# Patient Record
Sex: Female | Born: 1965 | Race: Black or African American | Hispanic: No | Marital: Single | Smoking: Current every day smoker
Health system: Southern US, Community
[De-identification: ages and names within clinical notes are randomized; demographics above are authoritative.]

## PROBLEM LIST (undated history)

## (undated) DIAGNOSIS — M1990 Unspecified osteoarthritis, unspecified site: Secondary | ICD-10-CM

## (undated) DIAGNOSIS — I10 Essential (primary) hypertension: Secondary | ICD-10-CM

## (undated) DIAGNOSIS — R011 Cardiac murmur, unspecified: Secondary | ICD-10-CM

## (undated) HISTORY — DX: Cardiac murmur, unspecified: R01.1

## (undated) HISTORY — PX: CARDIAC SURGERY: SHX584

## (undated) HISTORY — PX: KNEE SURGERY: SHX244

## (undated) HISTORY — DX: Unspecified osteoarthritis, unspecified site: M19.90

## (undated) HISTORY — DX: Essential (primary) hypertension: I10

## (undated) HISTORY — PX: ABDOMINAL HYSTERECTOMY: SHX81

---

## 2017-07-04 ENCOUNTER — Ambulatory Visit (INDEPENDENT_AMBULATORY_CARE_PROVIDER_SITE_OTHER): Payer: No Typology Code available for payment source | Admitting: Family Medicine

## 2017-07-04 ENCOUNTER — Encounter: Payer: Self-pay | Admitting: Family Medicine

## 2017-07-04 VITALS — BP 127/90 | HR 69 | Temp 98.3°F | Ht 65.0 in | Wt 236.2 lb

## 2017-07-04 DIAGNOSIS — F17200 Nicotine dependence, unspecified, uncomplicated: Secondary | ICD-10-CM

## 2017-07-04 DIAGNOSIS — F1099 Alcohol use, unspecified with unspecified alcohol-induced disorder: Secondary | ICD-10-CM | POA: Diagnosis not present

## 2017-07-04 DIAGNOSIS — M19012 Primary osteoarthritis, left shoulder: Secondary | ICD-10-CM | POA: Diagnosis not present

## 2017-07-04 DIAGNOSIS — M170 Bilateral primary osteoarthritis of knee: Secondary | ICD-10-CM | POA: Diagnosis not present

## 2017-07-04 DIAGNOSIS — Z7689 Persons encountering health services in other specified circumstances: Secondary | ICD-10-CM

## 2017-07-04 DIAGNOSIS — IMO0002 Reserved for concepts with insufficient information to code with codable children: Secondary | ICD-10-CM

## 2017-07-04 DIAGNOSIS — I10 Essential (primary) hypertension: Secondary | ICD-10-CM

## 2017-07-04 LAB — CBC WITH DIFFERENTIAL/PLATELET
BASOS ABS: 94 {cells}/uL (ref 0–200)
BASOS PCT: 1 %
EOS ABS: 94 {cells}/uL (ref 15–500)
EOS PCT: 1 %
HCT: 41.9 % (ref 35.0–45.0)
HEMOGLOBIN: 13.9 g/dL (ref 11.7–15.5)
LYMPHS ABS: 2820 {cells}/uL (ref 850–3900)
Lymphocytes Relative: 30 %
MCH: 28.3 pg (ref 27.0–33.0)
MCHC: 33.2 g/dL (ref 32.0–36.0)
MCV: 85.3 fL (ref 80.0–100.0)
MPV: 10.2 fL (ref 7.5–12.5)
Monocytes Absolute: 752 cells/uL (ref 200–950)
Monocytes Relative: 8 %
NEUTROS ABS: 5640 {cells}/uL (ref 1500–7800)
Neutrophils Relative %: 60 %
PLATELETS: 278 10*3/uL (ref 140–400)
RBC: 4.91 MIL/uL (ref 3.80–5.10)
RDW: 13.9 % (ref 11.0–15.0)
WBC: 9.4 10*3/uL (ref 3.8–10.8)

## 2017-07-04 LAB — TSH: TSH: 3.07 mIU/L

## 2017-07-04 LAB — T4, FREE: FREE T4: 1.2 ng/dL (ref 0.8–1.8)

## 2017-07-04 NOTE — Progress Notes (Addendum)
Subjective:    Patient ID: Rose Little, female    DOB: February 11, 1966, 51 y.o.   MRN: [id number]  Chief Complaint  Patient presents with  . Establish Care    HPI Patient is in today to establish care.  She was formerly seen in [place], [place].  Pt recently moved to the area after ending a 16 yr relationship.  Pt has a good friend in the area.    Past medical history significant for  -hypertension: Last time on blood pressure medicine was in February 2017. -Arthritis: Left shoulder, bilateral knees. Last knee injection December 17. Injection and left knee was less effective than previously. -Unsure about thyroid problems. States may have had radiation in her 53s.  Social history: Patient recently ending 16 year relationship moved to the area from [place]. 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 Currently smokes 4 cigarettes per day times the last 20 years. Currently endorses ethanol use 2 12 ounce cans of beer per day, which is down from 4 cases per day. Patient endorses past marijuana use.  Past surgical history: Right meniscus surgery in 2016 by Dr. Val Eagle? in [place] Hysterectomy for fibroids years ago Cardiac ablation 09/2016  Family history: Mother-cirrhosis, lupus, hypertension Father-ethanol use, other history unknown Sister-hypertension, fibroids Maternal grandmother-hypertension, pacemaker Maternal grandfather-history unknown Paternal grandparents-history unknown  Past Medical History:  Diagnosis Date  . Arthritis   . Heart murmur   . Hypertension     Past Surgical History:  Procedure Laterality Date  . ABDOMINAL HYSTERECTOMY    . CARDIAC SURGERY    . KNEE SURGERY Right     Family History  Problem Relation Age of Onset  . Alcohol abuse Mother   . Stroke Mother   . Alcohol abuse Father   . Stroke Father   . Arthritis Maternal Grandmother   . Heart disease Maternal Grandmother   . Stroke Maternal Grandmother    . Hypertension Maternal Grandmother     Social History   Social History  . Marital status: Single    Spouse name: N/A  . Number of children: N/A  . Years of education: N/A   Occupational History  . Not on file.   Social History Main Topics  . Smoking status: Current Every Day Smoker    Packs/day: 4.00    Years: 20.00    Types: Cigarettes  . Smokeless tobacco: Never Used  . Alcohol use Yes     Comment: 2 cans beer/day  . Drug use: No  . Sexual activity: Not on file   Other Topics Concern  . Not on file   Social History Narrative  . No narrative on file    No outpatient prescriptions prior to visit.   No facility-administered medications prior to visit.     No Known Allergies  ROS  General: Denies fever, chills, night sweats, changes in weight, changes in appetite HEENT: Denies headaches, ear pain, changes in vision, rhinorrhea, sore throat CV: Denies CP, palpitations, SOB, orthopnea Pulm: Denies SOB, cough, wheezing   +Snoring GI: Denies abdominal pain, nausea, vomiting, diarrhea, constipation GU: Denies dysuria, hematuria, frequency, vaginal discharge Msk: Denies muscle cramps.  +H/O joint pains 2/2 arthritis Neuro: Denies weakness, numbness, tingling Skin: Denies rashes, bruising Psych: Denies depression, anxiety, hallucinations'     Objective:    Blood pressure 127/90, pulse 69, temperature 98.3 F (36.8 C), temperature source Oral, height 5\' 5"  (1.651 m), weight 236 lb 3.2 oz (107.1 kg), SpO2 99 %.  Gen. Pleasant, well-nourished, in no distress, normal affect  HEENT -[place]/AT, no lesions, face symmetric, no scleral icterus, PERRLA, no post nasal drip. Neck: No JVD, no thyromegaly, no carotid bruits Lungs: no accessory muscle uss, CTAB, no wheezes or rales Cardiovascular: RR, 2/6 murmur present.  no r/g, no peripheral edema Abdomen: soft and non-tender, no hepatospllenomegaly, BS normal. Musculoskeletal: No deformities, no cyanosis or clubbing, normal  tone Neuro:  A&Ox3, CN II-XII intact, normal gait Skin:  Warm, no lesions/ rash  Tattoos on UEs   Wt Readings from Last 3 Encounters:  07/04/17 236 lb 3.2 oz (107.1 kg)    Diabetic Foot Exam - Simple   No data filed     No results found for: WBC, HGB, HCT, PLT, GLUCOSE, CHOL, TRIG, HDL, LDLDIRECT, LDLCALC, ALT, AST, NA, K, CL, CREATININE, BUN, CO2, TSH, PSA, INR, GLUF, HGBA1C, MICROALBUR  Assessment/Plan:  1. Establishing care with new doctor, encounter for Records release form completed to obtain records from previous physicians in [place].  2. Essential hypertension Not currently on medication. Will f/u to recheck in 3 months. Lifestyle modifications encouraged. - TSH; Future - CBC with Differential/Platelet; Future - T4, Free; Future - TSH - CBC with Differential/Platelet - T4, Free  3. Alcohol use disorder (HCC) Currently drinking 2 12 oz cans/day.  Discussed continuing to cut down - CMP; Future - CMP  4. Tobacco use disorder -Smoking cessation counseling <10 min, advised to quit. -Pt currently smoking 4 cigarettes/day -Encouraged to decrease use by 1 cigarette per day.  Considering decreasing use. -offered support 1-800 QUIT NOW -[facility]  5. Arthritis, multiple sites (b/l knee, L shoulder) -currently stable

## 2017-07-04 NOTE — Patient Instructions (Addendum)
Today you were seen to establish care.  You were asked to sign a records release form so we can obtain your medical records from your previous provider.   Today you had labs drawn.  You should expect to hear back from us with your results in 2-3 business days.  Continue walking for exercise and eating more vegetables.    You are doing great at working on decreasing the number of cigarettes you are smoking per day.     Follow up in 3 months.

## 2017-07-05 LAB — COMPREHENSIVE METABOLIC PANEL
ALBUMIN: 4.5 g/dL (ref 3.6–5.1)
ALK PHOS: 81 U/L (ref 33–130)
ALT: 12 U/L (ref 6–29)
AST: 17 U/L (ref 10–35)
BUN: 12 mg/dL (ref 7–25)
CO2: 24 mmol/L (ref 20–32)
CREATININE: 0.69 mg/dL (ref 0.50–1.05)
Calcium: 9.8 mg/dL (ref 8.6–10.4)
Chloride: 104 mmol/L (ref 98–110)
Glucose, Bld: 88 mg/dL (ref 65–99)
Potassium: 4 mmol/L (ref 3.5–5.3)
SODIUM: 139 mmol/L (ref 135–146)
TOTAL PROTEIN: 7.5 g/dL (ref 6.1–8.1)
Total Bilirubin: 0.4 mg/dL (ref 0.2–1.2)

## 2017-07-08 NOTE — Progress Notes (Signed)
Lab results are all normal.  Patient called.  Left message for patient to call back.

## 2017-08-05 ENCOUNTER — Emergency Department (HOSPITAL_COMMUNITY)
Admission: EM | Admit: 2017-08-05 | Discharge: 2017-08-05 | Disposition: A | Discharge status: Home / Self Care | Payer: No Typology Code available for payment source | Attending: Emergency Medicine | Admitting: Emergency Medicine

## 2017-08-05 ENCOUNTER — Encounter (HOSPITAL_COMMUNITY): Payer: Self-pay

## 2017-08-05 ENCOUNTER — Emergency Department (HOSPITAL_COMMUNITY): Payer: No Typology Code available for payment source

## 2017-08-05 DIAGNOSIS — Y999 Unspecified external cause status: Secondary | ICD-10-CM | POA: Insufficient documentation

## 2017-08-05 DIAGNOSIS — M25562 Pain in left knee: Secondary | ICD-10-CM

## 2017-08-05 DIAGNOSIS — G8929 Other chronic pain: Secondary | ICD-10-CM

## 2017-08-05 DIAGNOSIS — Y939 Activity, unspecified: Secondary | ICD-10-CM | POA: Diagnosis not present

## 2017-08-05 DIAGNOSIS — S8392XA Sprain of unspecified site of left knee, initial encounter: Secondary | ICD-10-CM | POA: Diagnosis not present

## 2017-08-05 DIAGNOSIS — I10 Essential (primary) hypertension: Secondary | ICD-10-CM | POA: Insufficient documentation

## 2017-08-05 DIAGNOSIS — M25561 Pain in right knee: Secondary | ICD-10-CM | POA: Diagnosis not present

## 2017-08-05 DIAGNOSIS — F17210 Nicotine dependence, cigarettes, uncomplicated: Secondary | ICD-10-CM | POA: Diagnosis not present

## 2017-08-05 DIAGNOSIS — Y929 Unspecified place or not applicable: Secondary | ICD-10-CM | POA: Diagnosis not present

## 2017-08-05 DIAGNOSIS — X509XXA Other and unspecified overexertion or strenuous movements or postures, initial encounter: Secondary | ICD-10-CM | POA: Insufficient documentation

## 2017-08-05 DIAGNOSIS — S80912A Unspecified superficial injury of left knee, initial encounter: Secondary | ICD-10-CM | POA: Diagnosis present

## 2017-08-05 DIAGNOSIS — M25569 Pain in unspecified knee: Secondary | ICD-10-CM

## 2017-08-05 MED ORDER — HYDROCODONE-ACETAMINOPHEN 5-325 MG PO TABS: 1.0000 | ORAL_TABLET | Freq: Four times a day (QID) | ORAL | 0 refills | Status: DC | PRN

## 2017-08-05 MED ORDER — HYDROCODONE-ACETAMINOPHEN 5-325 MG PO TABS
2.0000 | ORAL_TABLET | Freq: Once | ORAL | Status: AC
  Administered 2017-08-05: 2 via ORAL
  Filled 2017-08-05: qty 2

## 2017-08-05 NOTE — ED Triage Notes (Signed)
Patient complains of bilateral knee pain that she describes as chronic. Does a lot of standing at work and reports worse for 1 day, denies trauma

## 2017-08-05 NOTE — ED Notes (Signed)
Pt to xray

## 2017-08-05 NOTE — Discharge Instructions (Signed)
Wear the knee immobilizer in the left knee for support and stabilization. Use the crutches to stay off of left knee and provide rest. With a knee sleeve on the right knee for support and stabilization.  You can take Tylenol or Ibuprofen as directed for pain. You can take the pain medication for severe breakthrough pain.  Follow the RICE (Rest, Ice, Compression, Elevation) protocol as directed.   Follow-up with referred orthopedic doctor for further evaluation of her knee pain. Call and arrange for an appointment. Told them that you were seen in the emergency department.  Follow-up with your primary care doctor regarding the high blood pressure. Your blood pressure needs to be reevaluated. It may be they need to be restarted on her medications.  Return the emergency Department for any worsening knee pain, redness/swelling of the knee, fevers, difficulty breathing, chest pain or any other worsening or concerning symptoms.

## 2017-08-05 NOTE — ED Provider Notes (Signed)
[facility]-EMERGENCY DEPT Provider Note   CSN: [id number] Arrival date & time: 08/05/17  0940     History   Chief Complaint Chief Complaint  Patient presents with  . Knee Pain    HPI Rose Little is a 51 y.o. female past medical history of arthritis who presents with bilateral knee pain, left greater than worse I began 2 days ago. Patient denies any preceding trauma, injury, fall. 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 Patient reports that she has a history of arthritis and takes Tylenol daily for the symptoms. She had a previous orthopedic doctor in [place] where she just recently moved from but no orthopedic follow-up in [place]. Patient reports that she still been able to ambulate but reports that her pain is worsened with ambulation or movement of the bilateral lower extremities. She denies any warmth or redness to bilateral knees. She denies any fever, chills, chest pain, difficulty breathing, numbness/weakness of her arms or legs.  The history is provided by the patient.    Past Medical History:  Diagnosis Date  . Arthritis   . Heart murmur   . Hypertension     Patient Active Problem List   Diagnosis Date Noted  . HTN (hypertension) 07/04/2017  . Tobacco use disorder 07/04/2017    Past Surgical History:  Procedure Laterality Date  . ABDOMINAL HYSTERECTOMY    . CARDIAC SURGERY    . KNEE SURGERY Right     OB History    No data available       Home Medications    Prior to Admission medications   Medication Sig Start Date End Date Taking? Authorizing Provider  HYDROcodone-acetaminophen (NORCO/VICODIN) 5-325 MG tablet Take 1-2 tablets by mouth every 6 (six) hours as needed. 08/05/17   Maxwell Caul, PA-C    Family History Family History  Problem Relation Age of Onset  . Alcohol abuse Mother   . Stroke Mother   . Alcohol abuse Father   . Stroke Father   . Arthritis Maternal Grandmother   . Heart disease  Maternal Grandmother   . Stroke Maternal Grandmother   . Hypertension Maternal Grandmother     Social History Social History  Substance Use Topics  . Smoking status: Current Every Day Smoker    Packs/day: 4.00    Years: 20.00    Types: Cigarettes  . Smokeless tobacco: Never Used  . Alcohol use Yes     Comment: 2 cans beer/day     Allergies   Patient has no known allergies.   Review of Systems Review of Systems  Constitutional: Negative for fever.  Respiratory: Negative for shortness of breath.   Cardiovascular: Negative for chest pain.  Gastrointestinal: Negative for nausea and vomiting.  Musculoskeletal:       Bilateral knee pain  Skin: Negative for color change.     Physical Exam Updated Vital Signs BP (!) 166/99   Pulse 61   Temp 98.4 F (36.9 C) (Oral)   Resp 18   SpO2 100%   Physical Exam  Constitutional: She appears well-developed and well-nourished.  Appears uncomfortable but no acute distress   HENT:  Head: Normocephalic and atraumatic.  Eyes: Conjunctivae and EOM are normal. Right eye exhibits no discharge. Left eye exhibits no discharge. No scleral icterus.  Pulmonary/Chest: Effort normal.  Musculoskeletal:  Tenderness palpation in the medial aspect of the right knee with no deformity or crepitus noted. No overlying soft tissue swelling, warmth, erythema, ecchymosis.  Flexion extension intact but with subjective reports of pain. Negative anterior/posterior drawer test. No laxity with valgus or varus stress. Tenderness palpation to the medial and inferior aspect of the left knee with no deformity or crepitus noted. No overlying soft tissue swelling, warmth, erythema, ecchymosis. Flexion and extension intact but with subjective reports of pain. Mild laxity noted on anterior drawer test. Mild laxity with varus stress.  Neurological: She is alert.  Skin: Skin is warm and dry.  Psychiatric: She has a normal mood and affect. Her speech is normal and behavior  is normal.  Nursing note and vitals reviewed.    ED Treatments / Results  Labs (all labs ordered are listed, but only abnormal results are displayed) Labs Reviewed - No data to display  EKG  EKG Interpretation None       Radiology Dg Knee Complete 4 Views Left  Result Date: 08/05/2017 CLINICAL DATA:  Knee pain. EXAM: LEFT KNEE - COMPLETE 4+ VIEW COMPARISON:  No recent prior. FINDINGS: Tricompartment degenerative change. No evidence of fracture or dislocation. IMPRESSION: Tricompartment degenerative change.  No acute abnormality. Electronically Signed   By: Maisie Fus  Register   On: 08/05/2017 12:14   Dg Knee Complete 4 Views Right  Result Date: 08/05/2017 CLINICAL DATA:  Bilateral knee pain chronically, stands at work EXAM: RIGHT KNEE - COMPLETE 4+ VIEW COMPARISON:  None. FINDINGS: There is somewhat age advanced tricompartmental degenerative joint disease primarily involving the medial compartment. Medially there is more loss of joint space with sclerosis and spurring present. No fracture is seen and no joint effusion is noted. 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 Electronically Signed   By: Dwyane Dee M.D.   On: 08/05/2017 12:17    Procedures Procedures (including critical care time)  Medications Ordered in ED Medications  HYDROcodone-acetaminophen (NORCO/VICODIN) 5-325 MG per tablet 2 tablet (2 tablets Oral Given 08/05/17 1119)     Initial Impression / Assessment and Plan / ED Course  I have reviewed the triage vital signs and the nursing notes.  Pertinent labs & imaging results that were available during my care of the patient were reviewed by me and considered in my medical decision making (see chart for details).     51 year old female who presents with bilateral knee pain. No preceding trauma, injury, fall. History of arthritis. Not currently followed by orthopedics. Patient is afebrile, non-toxic appearing, sitting  comfortably on examination table. Vital signs reviewed. Patient is slightly hypertensive, likely secondary to pain. Will plan to reassess. Physical exam shows tenderness bilateral knees. Left knee has some mild instability on anterior drawer test. Consider sprain versus dislocation versus fracture versus osteoarthritis. History/physical exam are not concerning for septic or alertness, DVT. We'll plan to obtain x-ray imaging for further evaluation.  X-ray reviewed. Negative for any acute fracture or dislocation. There is evidence of degenerative changes. Discussed results with patient. Given history/physical exam findings of left knee, concern that there is a left knee sprain. We'll plan to apply any immobilizer to left knee and gave patient crutches for support and stabilization. Also plan to apply knee sleeve to right knee.  Patient reviewed on the [place] substance database. She has no recent narcotic prescriptions. We'll plan to give her short course for pain control. Plan to provide outpatient orthopedic referral for further follow-up and evaluation. Strict return precautions discussed. Patient expresses understanding and agreement to plan.   Patient noted be hypertensive at initial vital evaluation. Patient has a history of hypertension and  reports that she was on medication but that she will stop because the hypertension was under control. Patient states that she is currently not on any blood pressure medication. She does have a primary care doctor establishing [place]. Patient is not complaining of any headache, vision changes, numbness/weakness of her arms or legs, chest pain at this time. At this time there is nothing to suggest hypertensive urgency or emergency.  Patient was advised to follow up with their PCP or Wellness clinic regarding their elevated blood pressure.   Final Clinical Impressions(s) / ED Diagnoses   Final diagnoses:  Knee pain  Chronic pain of both knees  Sprain of left knee,  unspecified ligament, initial encounter    New Prescriptions Discharge Medication List as of 08/05/2017  1:41 PM    START taking these medications   Details  HYDROcodone-acetaminophen (NORCO/VICODIN) 5-325 MG tablet Take 1-2 tablets by mouth every 6 (six) hours as needed., Starting Tue 08/05/2017, Print         Maxwell CaulLayden, Moet Mikulski A, PA-C 08/05/17 1724    Pricilla LovelessGoldston, Scott, MD 08/08/17 1118

## 2017-09-09 ENCOUNTER — Ambulatory Visit (INDEPENDENT_AMBULATORY_CARE_PROVIDER_SITE_OTHER): Payer: No Typology Code available for payment source | Admitting: Family Medicine

## 2017-09-09 VITALS — BP 193/100 | HR 88 | Temp 98.4°F | Wt 241.8 lb

## 2017-09-09 DIAGNOSIS — I10 Essential (primary) hypertension: Secondary | ICD-10-CM

## 2017-09-09 DIAGNOSIS — Z13220 Encounter for screening for lipoid disorders: Secondary | ICD-10-CM

## 2017-09-09 DIAGNOSIS — R0602 Shortness of breath: Secondary | ICD-10-CM

## 2017-09-09 DIAGNOSIS — R011 Cardiac murmur, unspecified: Secondary | ICD-10-CM

## 2017-09-09 LAB — LIPID PANEL
CHOL/HDL RATIO: 4
CHOLESTEROL: 192 mg/dL (ref 0–200)
HDL: 46.8 mg/dL (ref >39.00)
NonHDL: 144.74
TRIGLYCERIDES: 236 mg/dL — AB (ref 0.0–149.0)
VLDL: 47.2 mg/dL — AB (ref 0.0–40.0)

## 2017-09-09 LAB — BASIC METABOLIC PANEL
BUN: 15 mg/dL (ref 6–23)
CO2: 24 mEq/L (ref 19–32)
Calcium: 9.9 mg/dL (ref 8.4–10.5)
Chloride: 105 mEq/L (ref 96–112)
Creatinine, Ser: 0.68 mg/dL (ref 0.40–1.20)
GFR: 116.57 mL/min (ref >60.00)
Glucose, Bld: 140 mg/dL — ABNORMAL HIGH (ref 70–99)
Potassium: 4.2 mEq/L (ref 3.5–5.1)
Sodium: 140 mEq/L (ref 135–145)

## 2017-09-09 LAB — BRAIN NATRIURETIC PEPTIDE: Pro B Natriuretic peptide (BNP): 52 pg/mL (ref 0.0–100.0)

## 2017-09-09 LAB — LDL CHOLESTEROL, DIRECT: Direct LDL: 113 mg/dL

## 2017-09-09 MED ORDER — LISINOPRIL 10 MG PO TABS: 10.0000 mg | ORAL_TABLET | Freq: Every day | ORAL | 3 refills | Status: DC

## 2017-09-09 NOTE — Patient Instructions (Addendum)
DASH Eating Plan DASH stands for "Dietary Approaches to Stop Hypertension." The DASH eating plan is a healthy eating plan that has been shown to reduce high blood pressure (hypertension). It may also reduce your risk for type 2 diabetes, heart disease, and stroke. The DASH eating plan may also help with weight loss. What are tips for following this plan? General guidelines  Avoid eating more than 2,300 mg (milligrams) of salt (sodium) a day. If you have hypertension, you may need to reduce your sodium intake to 1,500 mg a day.  Limit alcohol intake to no more than 1 drink a day for nonpregnant women and 2 drinks a day for men. One drink equals 12 oz of beer, 5 oz of wine, or 1 oz of hard liquor.  Work with your health care provider to maintain a healthy body weight or to lose weight. Ask what an ideal weight is for you.  Get at least 30 minutes of exercise that causes your heart to beat faster (aerobic exercise) most days of the week. Activities may include walking, swimming, or biking.  Work with your health care provider or diet and nutrition specialist (dietitian) to adjust your eating plan to your individual calorie needs. Reading food labels  Check food labels for the amount of sodium per serving. Choose foods with less than 5 percent of the Daily Value of sodium. Generally, foods with less than 300 mg of sodium per serving fit into this eating plan.  To find whole grains, look for the word "whole" as the first word in the ingredient list. Shopping  Buy products labeled as "low-sodium" or "no salt added."  Buy fresh foods. Avoid canned foods and premade or frozen meals. Cooking  Avoid adding salt when cooking. Use salt-free seasonings or herbs instead of table salt or sea salt. Check with your health care provider or pharmacist before using salt substitutes.  Do not fry foods. Cook foods using healthy methods such as baking, boiling, grilling, and broiling instead.  Cook with  heart-healthy oils, such as olive, canola, soybean, or sunflower oil. Meal planning   Eat a balanced diet that includes: ? 5 or more servings of fruits and vegetables each day. At each meal, try to fill half of your plate with fruits and vegetables. ? Up to 6-8 servings of whole grains each day. ? Less than 6 oz of lean meat, poultry, or fish each day. A 3-oz serving of meat is about the same size as a deck of cards. One egg equals 1 oz. ? 2 servings of low-fat dairy each day. ? A serving of nuts, seeds, or beans 5 times each week. ? Heart-healthy fats. Healthy fats called Omega-3 fatty acids are found in foods such as flaxseeds and coldwater fish, like sardines, salmon, and mackerel.  Limit how much you eat of the following: ? Canned or prepackaged foods. ? Food that is high in trans fat, such as fried foods. ? Food that is high in saturated fat, such as fatty meat. ? Sweets, desserts, sugary drinks, and other foods with added sugar. ? Full-fat dairy products.  Do not salt foods before eating.  Try to eat at least 2 vegetarian meals each week.  Eat more home-cooked food and less restaurant, buffet, and fast food.  When eating at a restaurant, ask that your food be prepared with less salt or no salt, if possible. What foods are recommended? The items listed may not be a complete list. Talk with your dietitian about what   dietary choices are best for you. Grains Whole-grain or whole-wheat bread. Whole-grain or whole-wheat pasta. Brown rice. Oatmeal. Quinoa. Bulgur. Whole-grain and low-sodium cereals. Pita bread. Low-fat, low-sodium crackers. Whole-wheat flour tortillas. Vegetables Fresh or frozen vegetables (raw, steamed, roasted, or grilled). Low-sodium or reduced-sodium tomato and vegetable juice. Low-sodium or reduced-sodium tomato sauce and tomato paste. Low-sodium or reduced-sodium canned vegetables. Fruits All fresh, dried, or frozen fruit. Canned fruit in natural juice (without  added sugar). Meat and other protein foods Skinless chicken or turkey. Ground chicken or turkey. Pork with fat trimmed off. Fish and seafood. Egg whites. Dried beans, peas, or lentils. Unsalted nuts, nut butters, and seeds. Unsalted canned beans. Lean cuts of beef with fat trimmed off. Low-sodium, lean deli meat. Dairy Low-fat (1%) or fat-free (skim) milk. Fat-free, low-fat, or reduced-fat cheeses. Nonfat, low-sodium ricotta or cottage cheese. Low-fat or nonfat yogurt. Low-fat, low-sodium cheese. Fats and oils Soft margarine without trans fats. Vegetable oil. Low-fat, reduced-fat, or light mayonnaise and salad dressings (reduced-sodium). Canola, safflower, olive, soybean, and sunflower oils. Avocado. Seasoning and other foods Herbs. Spices. Seasoning mixes without salt. Unsalted popcorn and pretzels. Fat-free sweets. What foods are not recommended? The items listed may not be a complete list. Talk with your dietitian about what dietary choices are best for you. Grains Baked goods made with fat, such as croissants, muffins, or some breads. Dry pasta or rice meal packs. Vegetables Creamed or fried vegetables. Vegetables in a cheese sauce. Regular canned vegetables (not low-sodium or reduced-sodium). Regular canned tomato sauce and paste (not low-sodium or reduced-sodium). Regular tomato and vegetable juice (not low-sodium or reduced-sodium). Pickles. Olives. Fruits Canned fruit in a light or heavy syrup. Fried fruit. Fruit in cream or butter sauce. Meat and other protein foods Fatty cuts of meat. Ribs. Fried meat. Bacon. Sausage. Bologna and other processed lunch meats. Salami. Fatback. Hotdogs. Bratwurst. Salted nuts and seeds. Canned beans with added salt. Canned or smoked fish. Whole eggs or egg yolks. Chicken or turkey with skin. Dairy Whole or 2% milk, cream, and half-and-half. Whole or full-fat cream cheese. Whole-fat or sweetened yogurt. Full-fat cheese. Nondairy creamers. Whipped toppings.  Processed cheese and cheese spreads. Fats and oils Butter. Stick margarine. Lard. Shortening. Ghee. Bacon fat. Tropical oils, such as coconut, palm kernel, or palm oil. Seasoning and other foods Salted popcorn and pretzels. Onion salt, garlic salt, seasoned salt, table salt, and sea salt. Worcestershire sauce. Tartar sauce. Barbecue sauce. Teriyaki sauce. Soy sauce, including reduced-sodium. Steak sauce. Canned and packaged gravies. Fish sauce. Oyster sauce. Cocktail sauce. Horseradish that you find on the shelf. Ketchup. Mustard. Meat flavorings and tenderizers. Bouillon cubes. Hot sauce and Tabasco sauce. Premade or packaged marinades. Premade or packaged taco seasonings. Relishes. Regular salad dressings. Where to find more information:  National Heart, Lung, and Blood Institute: www.nhlbi.nih.gov  American Heart Association: www.heart.org Summary  The DASH eating plan is a healthy eating plan that has been shown to reduce high blood pressure (hypertension). It may also reduce your risk for type 2 diabetes, heart disease, and stroke.  With the DASH eating plan, you should limit salt (sodium) intake to 2,300 mg a day. If you have hypertension, you may need to reduce your sodium intake to 1,500 mg a day.  When on the DASH eating plan, aim to eat more fresh fruits and vegetables, whole grains, lean proteins, low-fat dairy, and heart-healthy fats.  Work with your health care provider or diet and nutrition specialist (dietitian) to adjust your eating plan to your individual   calorie needs. This information is not intended to replace advice given to you by your health care provider. Make sure you discuss any questions you have with your health care provider. Document Released: 10/31/2011 Document Revised: 11/04/2016 Document Reviewed: 11/04/2016 Elsevier Interactive Patient Education  2017 [employer] Hypertension Hypertension is another name for high blood pressure. High blood pressure forces  your heart to work harder to pump blood. This can cause problems over time. There are two numbers in a blood pressure reading. There is a top number (systolic) over a bottom number (diastolic). It is best to have a blood pressure below 120/80. Healthy choices can help lower your blood pressure. You may need medicine to help lower your blood pressure if:  Your blood pressure cannot be lowered with healthy choices.  Your blood pressure is higher than 130/80.  Follow these instructions at home: Eating and drinking  If directed, follow the DASH eating plan. This diet includes: ? Filling half of your plate at each meal with fruits and vegetables. ? Filling one quarter of your plate at each meal with whole grains. Whole grains include whole wheat pasta, brown rice, and whole grain bread. ? Eating or drinking low-fat dairy products, such as skim milk or low-fat yogurt. ? Filling one quarter of your plate at each meal with low-fat (lean) proteins. Low-fat proteins include fish, skinless chicken, eggs, beans, and tofu. ? Avoiding fatty meat, cured and processed meat, or chicken with skin. ? Avoiding premade or processed food.  Eat less than 1,500 mg of salt (sodium) a day.  Limit alcohol use to no more than 1 drink a day for nonpregnant women and 2 drinks a day for men. One drink equals 12 oz of beer, 5 oz of wine, or 1 oz of hard liquor. Lifestyle  Work with your doctor to stay at a healthy weight or to lose weight. Ask your doctor what the best weight is for you.  Get at least 30 minutes of exercise that causes your heart to beat faster (aerobic exercise) most days of the week. This may include walking, swimming, or biking.  Get at least 30 minutes of exercise that strengthens your muscles (resistance exercise) at least 3 days a week. This may include lifting weights or pilates.  Do not use any products that contain nicotine or tobacco. This includes cigarettes and e-cigarettes. If you need  help quitting, ask your doctor.  Check your blood pressure at home as told by your doctor.  Keep all follow-up visits as told by your doctor. This is important. Medicines  Take over-the-counter and prescription medicines only as told by your doctor. Follow directions carefully.  Do not skip doses of blood pressure medicine. The medicine does not work as well if you skip doses. Skipping doses also puts you at risk for problems.  Ask your doctor about side effects or reactions to medicines that you should watch for. Contact a doctor if:  You think you are having a reaction to the medicine you are taking.  You have headaches that keep coming back (recurring).  You feel dizzy.  You have swelling in your ankles.  You have trouble with your vision. Get help right away if:  You get a very bad headache.  You start to feel confused.  You feel weak or numb.  You feel faint.  You get very bad pain in your: ? Chest. ? Belly (abdomen).  You throw up (vomit) more than once.  You have trouble breathing. Summary    Hypertension is another name for high blood pressure.  Making healthy choices can help lower blood pressure. If your blood pressure cannot be controlled with healthy choices, you may need to take medicine. This information is not intended to replace advice given to you by your health care provider. Make sure you discuss any questions you have with your health care provider. Document Released: 04/29/2008 Document Revised: 10/09/2016 Document Reviewed: 10/09/2016 Elsevier Interactive Patient Education  2018 [employer] Inc[employer]  

## 2017-09-09 NOTE — Progress Notes (Signed)
Subjective:    Patient ID: Rose Little, female    DOB: [birth year], 51 y.o.   MRN: [id number]  No chief complaint on file.   HPI Patient was seen today for acute concern.  HTN, acute: -pt with h/o HTN.  Formerly on medicine yrs ago, but can't remember what it was. -BP noted to be elevated at Ortho, Dr. Champ Mungo office (205/198, 196/105) -Pt also endorses increased stress.  Had to move out of her friends house abruptly.  Staying with a family friend until her apt is ready in Nov.  Pt was not able get all of her belongings from her previous residence. -Denies HAs, CP, blurred vision, N/V, neck pain.  Breathing issues: -pt states at rest she has been breathing heavy, "like I'm snoring, but I'm awake" -This has been an ongoing issue, x yrs -difficulty going up 1-2 flights of stairs.  Has to rest after the 1st.  Is very winded after the 2nd. -also endorses pedal edema  Past Medical History:  Diagnosis Date  . Arthritis   . Heart murmur   . Hypertension     No Known Allergies  ROS General: Denies fever, chills, night sweats, changes in weight, changes in appetite HEENT: Denies headaches, ear pain, changes in vision, rhinorrhea, sore throat CV: Denies CP, palpitations, orthopnea  +pedal edema, SOB Pulm: Denies cough, wheezing  +dyspnea, SOB GI: Denies abdominal pain, nausea, vomiting, diarrhea, constipation GU: Denies dysuria, hematuria, frequency, vaginal discharge Msk: Denies muscle cramps, joint pains Neuro: Denies weakness, numbness, tingling Skin: Denies rashes, bruising Psych: Denies depression, anxiety, hallucinations     Objective:    SpO2 98 %.   Gen. Pleasant, well-nourished, in no distress, normal affect HEENT: Powers/AT, face symmetric,no scleral icterus, PERRLA, EOMI, nares patent without drainage. Neck: No JVD, no thyromegaly Lungs: no accessory muscle use, CTAB, no wheezes or rales Cardiovascular: RRR, 2/6 murmur best heard L upper sternal border,  1+  peripheral edema Neuro:  A&Ox3, CN II-XII intact, normal gait Skin:  Warm, no lesions/ rash   Wt Readings from Last 3 Encounters:  07/04/17 236 lb 3.2 oz (107.1 kg)    Lab Results  Component Value Date   WBC 9.4 07/04/2017   HGB 13.9 07/04/2017   HCT 41.9 07/04/2017   PLT 278 07/04/2017   GLUCOSE 88 07/04/2017   ALT 12 07/04/2017   AST 17 07/04/2017   NA 139 07/04/2017   K 4.0 07/04/2017   CL 104 07/04/2017   CREATININE 0.69 07/04/2017   BUN 12 07/04/2017   CO2 24 07/04/2017   TSH 3.07 07/04/2017    Assessment/Plan:  Essential hypertension  -Discussed ways to reduce stress, eat better, exercise to help with blood pressure -Given handout -Discussed reducing sodium intake. -Will start medication this visit -We'll have patient follow-up in 1 week for BP - Plan: Basic metabolic panel, Brain Natriuretic Peptide, lisinopril (PRINIVIL,ZESTRIL) 10 MG tablet  Screening cholesterol level  - Plan: Lipid panel  Shortness of breath -Discussed possible causes including deconditioning, CHF, infection -Will obtain BNP. -Will also obtain cardiac echo -Discussed elevating lower extremities when resting  Murmur, cardiac  - Plan: ECHOCARDIOGRAM LIMITED   F/u in 1 wk for bp.

## 2017-09-16 ENCOUNTER — Encounter: Payer: Self-pay | Admitting: Family Medicine

## 2017-09-16 ENCOUNTER — Ambulatory Visit (INDEPENDENT_AMBULATORY_CARE_PROVIDER_SITE_OTHER): Payer: No Typology Code available for payment source | Admitting: Family Medicine

## 2017-09-16 VITALS — BP 150/82 | HR 70 | Wt 235.4 lb

## 2017-09-16 DIAGNOSIS — I10 Essential (primary) hypertension: Secondary | ICD-10-CM

## 2017-09-16 MED ORDER — LISINOPRIL 20 MG PO TABS: 20.0000 mg | ORAL_TABLET | Freq: Every day | ORAL | 3 refills | Status: DC

## 2017-09-16 NOTE — Patient Instructions (Addendum)
At this visit we increased your blood pressure medicine, lisinopril to 20 mg.  A new prescription was sent to your pharmacy for the lisinopril 20 mg. Using come back to clinic in the next 2-3 weeks to follow-up on her blood pressure. If you have problems before then feel free to come back sooner.  The referral for your echo was placed last week. Please expect a call from the office to follow-up on. Heart Disease Prevention Heart disease is a leading cause of death. There are many things you can do to help prevent heart disease. Be physically active Physical activity is good for your heart. It helps control your blood pressure, cholesterol levels, and weight. Try to be physically active every day. Ask your health care provider what activities are best for you. Be a healthy weight Extra weight can strain your heart and affect your blood pressure and cholesterol levels. Lose weight with diet and exercise if recommended by your health care provider. Eat heart-healthy foods Follow a healthy eating plan as recommended by your health care provider or dietitian. Heart-healthy foods include:  High-fiber foods. These include oat bran, oatmeal, and whole-grain breads and cereals.  Fruits and vegetables.  Avoid:  Alcohol.  Fried foods.  Foods high in saturated fat. These include meats, butter, whole dairy products, shortening, and coconut or palm oil.  Salty foods. These include canned food, luncheon meat, salty snacks, and fast food.  Keep your cholesterol levels under control Cholesterol is a substance that is used for many important functions. When your cholesterol levels are high, cholesterol can stick to the insides of your blood vessels, making them narrow or clog. This can lead to chest pain (angina) and a heart attack. Keep your cholesterol levels under control as recommended by your health care provider. Have your cholesterol checked at least once a year. Target cholesterol levels (in mg/dL)  for most people are:  Total cholesterol below 200.  LDL cholesterol below 100.  HDL cholesterol above 40 in men and above 50 in women.  Triglycerides below 150.  Keep your blood pressure under control Having high blood pressure (hypertension) puts you at risk for stroke and other forms of heart disease. Keep your blood pressure under control as recommended by your health care provider. Ask your health care provider if you need treatment to lower your blood pressure. If you are 25-54 years of age, have your blood pressure checked every 3-5 years. If you are 77 years of age or older, have your blood pressure checked every year. Do not use tobacco products Tobacco smoke can damage your heart and blood vessels. Do not use any tobacco products including cigarettes, chewing tobacco, or electronic cigarettes. If you need help quitting, ask your health care provider. Take medicines as directed Take medicines only as directed by your health care provider. Ask your health care provider whether you should take an aspirin every day. Taking aspirin can help reduce your risk of heart disease and stroke. Where to find more information: To find out more about heart disease, visit the American Heart Association's website at www.americanheart.org This information is not intended to replace advice given to you by your health care provider. Make sure you discuss any questions you have with your health care provider. Document Released: 06/25/2004 Document Revised: 04/10/2016 Document Reviewed: 01/05/2014 Elsevier Interactive Patient Education  2017 [employer] Inc.  DASH Eating Plan DASH stands for "Dietary Approaches to Stop Hypertension." The DASH eating plan is a healthy eating plan that has been  shown to reduce high blood pressure (hypertension). It may also reduce your risk for type 2 diabetes, heart disease, and stroke. The DASH eating plan may also help with weight loss. What are tips for following this  plan? General guidelines  Avoid eating more than 2,300 mg (milligrams) of salt (sodium) a day. If you have hypertension, you may need to reduce your sodium intake to 1,500 mg a day.  Limit alcohol intake to no more than 1 drink a day for nonpregnant women and 2 drinks a day for men. One drink equals 12 oz of beer, 5 oz of wine, or 1 oz of hard liquor.  Work with your health care provider to maintain a healthy body weight or to lose weight. Ask what an ideal weight is for you.  Get at least 30 minutes of exercise that causes your heart to beat faster (aerobic exercise) most days of the week. Activities may include walking, swimming, or biking.  Work with your health care provider or diet and nutrition specialist (dietitian) to adjust your eating plan to your individual calorie needs. Reading food labels  Check food labels for the amount of sodium per serving. Choose foods with less than 5 percent of the Daily Value of sodium. Generally, foods with less than 300 mg of sodium per serving fit into this eating plan.  To find whole grains, look for the word "whole" as the first word in the ingredient list. Shopping  Buy products labeled as "low-sodium" or "no salt added."  Buy fresh foods. Avoid canned foods and premade or frozen meals. Cooking  Avoid adding salt when cooking. Use salt-free seasonings or herbs instead of table salt or sea salt. Check with your health care provider or pharmacist before using salt substitutes.  Do not fry foods. Cook foods using healthy methods such as baking, boiling, grilling, and broiling instead.  Cook with heart-healthy oils, such as olive, canola, soybean, or sunflower oil. Meal planning   Eat a balanced diet that includes: ? 5 or more servings of fruits and vegetables each day. At each meal, try to fill half of your plate with fruits and vegetables. ? Up to 6-8 servings of whole grains each day. ? Less than 6 oz of lean meat, poultry, or fish each  day. A 3-oz serving of meat is about the same size as a deck of cards. One egg equals 1 oz. ? 2 servings of low-fat dairy each day. ? A serving of nuts, seeds, or beans 5 times each week. ? Heart-healthy fats. Healthy fats called Omega-3 fatty acids are found in foods such as flaxseeds and coldwater fish, like sardines, salmon, and mackerel.  Limit how much you eat of the following: ? Canned or prepackaged foods. ? Food that is high in trans fat, such as fried foods. ? Food that is high in saturated fat, such as fatty meat. ? Sweets, desserts, sugary drinks, and other foods with added sugar. ? Full-fat dairy products.  Do not salt foods before eating.  Try to eat at least 2 vegetarian meals each week.  Eat more home-cooked food and less restaurant, buffet, and fast food.  When eating at a restaurant, ask that your food be prepared with less salt or no salt, if possible. What foods are recommended? The items listed may not be a complete list. Talk with your dietitian about what dietary choices are best for you. Grains Whole-grain or whole-wheat bread. Whole-grain or whole-wheat pasta. Brown rice. Orpah Cobb. Bulgur. Whole-grain and  low-sodium cereals. Pita bread. Low-fat, low-sodium crackers. Whole-wheat flour tortillas. Vegetables Fresh or frozen vegetables (raw, steamed, roasted, or grilled). Low-sodium or reduced-sodium tomato and vegetable juice. Low-sodium or reduced-sodium tomato sauce and tomato paste. Low-sodium or reduced-sodium canned vegetables. Fruits All fresh, dried, or frozen fruit. Canned fruit in natural juice (without added sugar). Meat and other protein foods Skinless chicken or [place]. Ground chicken or [place]. Pork with fat trimmed off. Fish and seafood. Egg whites. Dried beans, peas, or lentils. Unsalted nuts, nut butters, and seeds. Unsalted canned beans. Lean cuts of beef with fat trimmed off. Low-sodium, lean deli meat. Dairy Low-fat (1%) or fat-free (skim)  milk. Fat-free, low-fat, or reduced-fat cheeses. Nonfat, low-sodium ricotta or cottage cheese. Low-fat or nonfat yogurt. Low-fat, low-sodium cheese. Fats and oils Soft margarine without trans fats. Vegetable oil. Low-fat, reduced-fat, or light mayonnaise and salad dressings (reduced-sodium). Canola, safflower, olive, soybean, and sunflower oils. Avocado. Seasoning and other foods Herbs. Spices. Seasoning mixes without salt. Unsalted popcorn and pretzels. Fat-free sweets. What foods are not recommended? The items listed may not be a complete list. Talk with your dietitian about what dietary choices are best for you. Grains Baked goods made with fat, such as croissants, muffins, or some breads. Dry pasta or rice meal packs. Vegetables Creamed or fried vegetables. Vegetables in a cheese sauce. Regular canned vegetables (not low-sodium or reduced-sodium). Regular canned tomato sauce and paste (not low-sodium or reduced-sodium). Regular tomato and vegetable juice (not low-sodium or reduced-sodium). Rosita Fire. Olives. Fruits Canned fruit in a light or heavy syrup. Fried fruit. Fruit in cream or butter sauce. Meat and other protein foods Fatty cuts of meat. Ribs. Fried meat. Tomasa Blase. Sausage. Bologna and other processed lunch meats. Salami. Fatback. Hotdogs. Bratwurst. Salted nuts and seeds. Canned beans with added salt. Canned or smoked fish. Whole eggs or egg yolks. Chicken or [place] with skin. Dairy Whole or 2% milk, cream, and half-and-half. Whole or full-fat cream cheese. Whole-fat or sweetened yogurt. Full-fat cheese. Nondairy creamers. Whipped toppings. Processed cheese and cheese spreads. Fats and oils Butter. Stick margarine. Lard. Shortening. Ghee. Bacon fat. Tropical oils, such as coconut, palm kernel, or palm oil. Seasoning and other foods Salted popcorn and pretzels. Onion salt, garlic salt, seasoned salt, table salt, and sea salt. Worcestershire sauce. Tartar sauce. Barbecue sauce. Teriyaki  sauce. Soy sauce, including reduced-sodium. Steak sauce. Canned and packaged gravies. Fish sauce. Oyster sauce. Cocktail sauce. Horseradish that you find on the shelf. Ketchup. Mustard. Meat flavorings and tenderizers. Bouillon cubes. Hot sauce and Tabasco sauce. Premade or packaged marinades. Premade or packaged taco seasonings. Relishes. Regular salad dressings. Where to find more information:  National Heart, Lung, and Blood Institute: PopSteam.is  American Heart Association: www.heart.org Summary  The DASH eating plan is a healthy eating plan that has been shown to reduce high blood pressure (hypertension). It may also reduce your risk for type 2 diabetes, heart disease, and stroke.  With the DASH eating plan, you should limit salt (sodium) intake to 2,300 mg a day. If you have hypertension, you may need to reduce your sodium intake to 1,500 mg a day.  When on the DASH eating plan, aim to eat more fresh fruits and vegetables, whole grains, lean proteins, low-fat dairy, and heart-healthy fats.  Work with your health care provider or diet and nutrition specialist (dietitian) to adjust your eating plan to your individual calorie needs. This information is not intended to replace advice given to you by your health care provider. Make sure you discuss  any questions you have with your health care provider. Document Released: 10/31/2011 Document Revised: 11/04/2016 Document Reviewed: 11/04/2016 Elsevier Interactive Patient Education  2017 Elsevier Inc.  Coping with Quitting Smoking Quitting smoking is a physical and mental challenge. You will face cravings, withdrawal symptoms, and temptation. Before quitting, work with your health care provider to make a plan that can help you cope. Preparation can help you quit and keep you from giving in. How can I cope with cravings? Cravings usually last for 5-10 minutes. If you get through it, the craving will pass. Consider taking the following  actions to help you cope with cravings:  Keep your mouth busy: ? Chew sugar-free gum. ? Suck on hard candies or a straw. ? Brush your teeth.  Keep your hands and body busy: ? Immediately change to a different activity when you feel a craving. ? Squeeze or play with a ball. ? Do an activity or a hobby, like making bead jewelry, practicing needlepoint, or working with wood. ? Mix up your normal routine. ? Take a short exercise break. Go for a quick walk or run up and down stairs. ? Spend time in public places where smoking is not allowed.  Focus on doing something kind or helpful for someone else.  Call a friend or family member to talk during a craving.  Join a support group.  Call a quit line, such as 1-800-QUIT-NOW.  Talk with your health care provider about medicines that might help you cope with cravings and make quitting easier for you.  How can I deal with withdrawal symptoms? Your body may experience negative effects as it tries to get used to not having nicotine in the system. These effects are called withdrawal symptoms. They may include:  Feeling hungrier than normal.  Trouble concentrating.  Irritability.  Trouble sleeping.  Feeling depressed.  Restlessness and agitation.  Craving a cigarette.  To manage withdrawal symptoms:  Avoid places, people, and activities that trigger your cravings.  Remember why you want to quit.  Get plenty of sleep.  Avoid coffee and other caffeinated drinks. These may worsen some of your symptoms.  How can I handle social situations? Social situations can be difficult when you are quitting smoking, especially in the first few weeks. To manage this, you can:  Avoid parties, bars, and other social situations where people might be smoking.  Avoid alcohol.  Leave right away if you have the urge to smoke.  Explain to your family and friends that you are quitting smoking. Ask for understanding and support.  Plan activities  with friends or family where smoking is not an option.  What are some ways I can cope with stress? Wanting to smoke may cause stress, and stress can make you want to smoke. Find ways to manage your stress. Relaxation techniques can help. For example:  Breathe slowly and deeply, in through your nose and out through your mouth.  Listen to soothing, relaxing music.  Talk with a family member or friend about your stress.  Light a candle.  Soak in a bath or take a shower.  Think about a peaceful place.  What are some ways I can prevent weight gain? Be aware that many people gain weight after they quit smoking. However, not everyone does. To keep from gaining weight, have a plan in place before you quit and stick to the plan after you quit. Your plan should include:  Having healthy snacks. When you have a craving, it may help to: ?  Eat plain popcorn, crunchy carrots, celery, or other cut vegetables. ? Chew sugar-free gum.  Changing how you eat: ? Eat small portion sizes at meals. ? Eat 4-6 small meals throughout the day instead of 1-2 large meals a day. ? Be mindful when you eat. Do not watch television or do other things that might distract you as you eat.  Exercising regularly: ? Make time to exercise each day. If you do not have time for a long workout, do short bouts of exercise for 5-10 minutes several times a day. ? Do some form of strengthening exercise, like weight lifting, and some form of aerobic exercise, like running or swimming.  Drinking plenty of water or other low-calorie or no-calorie drinks. Drink 6-8 glasses of water daily, or as much as instructed by your health care provider.  Summary  Quitting smoking is a physical and mental challenge. You will face cravings, withdrawal symptoms, and temptation to smoke again. Preparation can help you as you go through these challenges.  You can cope with cravings by keeping your mouth busy (such as by chewing gum), keeping  your body and hands busy, and making calls to family, friends, or a helpline for people who want to quit smoking.  You can cope with withdrawal symptoms by avoiding places where people smoke, avoiding drinks with caffeine, and getting plenty of rest.  Ask your health care provider about the different ways to prevent weight gain, avoid stress, and handle social situations. This information is not intended to replace advice given to you by your health care provider. Make sure you discuss any questions you have with your health care provider. Document Released: 11/08/2016 Document Revised: 11/08/2016 Document Reviewed: 11/08/2016 Elsevier Interactive Patient Education  [employer]2018 Elsevier Inc.

## 2017-09-16 NOTE — Progress Notes (Signed)
Subjective:    Patient ID: Rose HightVeronica Annette Little, female    DOB: 08/07/1966, 51 y.o.   MRN: [id number]  Chief Complaint  Patient presents with  . Follow-up    HPI Patient was seen today for f/u on bp. Pt states she has been feeling better.  She states she wakes up and her eyes are no longer red. Pt reports no cough or other issues on lisinopril 10 mg.  Past Medical History:  Diagnosis Date  . Arthritis   . Heart murmur   . Hypertension     No Known Allergies  ROS General: Denies fever, chills, night sweats, changes in weight, changes in appetite HEENT: Denies headaches, ear pain, changes in vision, rhinorrhea, sore throat CV: Denies CP, palpitations, SOB, orthopnea Pulm: Denies SOB, cough, wheezing GI: Denies abdominal pain, nausea, vomiting, diarrhea, constipation GU: Denies dysuria, hematuria, frequency, vaginal discharge Msk: Denies muscle cramps, joint pains Neuro: Denies weakness, numbness, tingling Skin: Denies rashes, bruising Psych: Denies depression, anxiety, hallucinations     Objective:    Blood pressure (!) 150/82, pulse 70, weight 235 lb 6.4 oz (106.8 kg).   Gen. Pleasant, well-nourished, in no distress, normal affect  HEENT: [place]/AT, face symmetric, conjunctiva clear, no scleral icterus, PERRLA, EOMI, nares patent without drainage Lungs: no accessory muscle use, CTAB, no wheezes or rales Cardiovascular: RRR, no m/r/g, no peripheral edema Neuro:  A&Ox3, CN II-XII intact, normal gait Skin:  Warm, no lesions/ rash   Wt Readings from Last 3 Encounters:  09/16/17 235 lb 6.4 oz (106.8 kg)  09/09/17 241 lb 12.8 oz (109.7 kg)  07/04/17 236 lb 3.2 oz (107.1 kg)    Lab Results  Component Value Date   WBC 9.4 07/04/2017   HGB 13.9 07/04/2017   HCT 41.9 07/04/2017   PLT 278 07/04/2017   GLUCOSE 140 (H) 09/09/2017   CHOL 192 09/09/2017   TRIG 236.0 (H) 09/09/2017   HDL 46.80 09/09/2017   LDLDIRECT 113.0 09/09/2017   ALT 12 07/04/2017   AST 17 07/04/2017   NA 140 09/09/2017   K 4.2 09/09/2017   CL 105 09/09/2017   CREATININE 0.68 09/09/2017   BUN 15 09/09/2017   CO2 24 09/09/2017   TSH 3.07 07/04/2017    Assessment/Plan:  Essential hypertension  -improving -Continue lifestyle modifications -Will increase lisinopril to 20 mg daily -Plan: lisinopril (PRINIVIL,ZESTRIL) 20 MG tablet -f/u in 2 wks for bp.   Update: Echo scheduled for 09/17/17 at 2:00pm [address]  [phone] Information passed along to patient. She is aware.

## 2017-09-17 ENCOUNTER — Ambulatory Visit (HOSPITAL_COMMUNITY): Payer: No Typology Code available for payment source | Attending: Cardiovascular Disease

## 2017-09-17 ENCOUNTER — Other Ambulatory Visit: Payer: Self-pay

## 2017-09-17 ENCOUNTER — Other Ambulatory Visit: Payer: Self-pay | Admitting: Family Medicine

## 2017-09-17 DIAGNOSIS — R011 Cardiac murmur, unspecified: Secondary | ICD-10-CM

## 2017-10-09 ENCOUNTER — Ambulatory Visit: Payer: No Typology Code available for payment source | Admitting: Family Medicine

## 2017-10-13 ENCOUNTER — Encounter: Payer: Self-pay | Admitting: Family Medicine

## 2017-10-13 ENCOUNTER — Ambulatory Visit (INDEPENDENT_AMBULATORY_CARE_PROVIDER_SITE_OTHER): Payer: No Typology Code available for payment source | Admitting: Family Medicine

## 2017-10-13 VITALS — BP 110/82 | HR 72 | Temp 98.7°F | Wt 239.0 lb

## 2017-10-13 DIAGNOSIS — I10 Essential (primary) hypertension: Secondary | ICD-10-CM

## 2017-10-13 NOTE — Progress Notes (Signed)
Subjective:    Patient ID: Rose HightVeronica Annette Little, female    DOB: 07/11/1966, 51 y.o.   MRN: [id number]  No chief complaint on file.   HPI Patient was seen today for f/u on HTN.  Pt states she has been doing well on Lisinopril. She has been feeling better overall.  Pt denies HAs, changes in vision, CP, dry cough, neck pain.  Re-reviewed results of ECHO as pt was at work when she initially heard the results.  Past Medical History:  Diagnosis Date  . Arthritis   . Heart murmur   . Hypertension     No Known Allergies  ROS General: Denies fever, chills, night sweats, changes in weight, changes in appetite HEENT: Denies headaches, ear pain, changes in vision, rhinorrhea, sore throat CV: Denies CP, palpitations, SOB, orthopnea Pulm: Denies SOB, cough, wheezing GI: Denies abdominal pain, nausea, vomiting, diarrhea, constipation GU: Denies dysuria, hematuria, frequency, vaginal discharge Msk: Denies muscle cramps, joint pains Neuro: Denies weakness, numbness, tingling Skin: Denies rashes, bruising Psych: Denies depression, anxiety, hallucinations     Objective:    Blood pressure 110/82, pulse 72, temperature 98.7 F (37.1 C), temperature source Oral, weight 239 lb (108.4 kg).   Gen. Pleasant, well-nourished, in no distress, normal affect  HEENT: [place]/AT, face symmetric, no scleral icterus, PERRLA, nares patent without drainage Lungs: no accessory muscle use, CTAB, no wheezes or rales Cardiovascular: RRR, no m/r/g, no peripheral edema Neuro:  A&Ox3, CN II-XII intact, normal gait   Wt Readings from Last 3 Encounters:  10/13/17 239 lb (108.4 kg)  09/16/17 235 lb 6.4 oz (106.8 kg)  09/09/17 241 lb 12.8 oz (109.7 kg)    Lab Results  Component Value Date   WBC 9.4 07/04/2017   HGB 13.9 07/04/2017   HCT 41.9 07/04/2017   PLT 278 07/04/2017   GLUCOSE 140 (H) 09/09/2017   CHOL 192 09/09/2017   TRIG 236.0 (H) 09/09/2017   HDL 46.80 09/09/2017   LDLDIRECT 113.0 09/09/2017   ALT  12 07/04/2017   AST 17 07/04/2017   NA 140 09/09/2017   K 4.2 09/09/2017   CL 105 09/09/2017   CREATININE 0.68 09/09/2017   BUN 15 09/09/2017   CO2 24 09/09/2017   TSH 3.07 07/04/2017    Assessment/Plan:  Essential hypertension -continue lisinopril 20 mg daily -Obtain or find bp cuff to check bp at home daily -Continue to decrease sodium intake and increase physical activity.  Reviewed ECHO results.  F/u in 1-2 month

## 2017-10-13 NOTE — Patient Instructions (Addendum)
ECHO was largely normal. As we age, the function of most of our organs decreases some. This is true of your heart. The ECHO results show some grade 1 diastolic dysfunction. This just means the bottom left side of the heart did not relax all the way. Your heart is still working well and there is nothing to do at this time.          Managing Your Hypertension Hypertension is commonly called high blood pressure. This is when the force of your blood pressing against the walls of your arteries is too strong. Arteries are blood vessels that carry blood from your heart throughout your body. Hypertension forces the heart to work harder to pump blood, and may cause the arteries to become narrow or stiff. Having untreated or uncontrolled hypertension can cause heart attack, stroke, kidney disease, and other problems. What are blood pressure readings? A blood pressure reading consists of a higher number over a lower number. Ideally, your blood pressure should be below 120/80. The first ("top") number is called the systolic pressure. It is a measure of the pressure in your arteries as your heart beats. The second ("bottom") number is called the diastolic pressure. It is a measure of the pressure in your arteries as the heart relaxes. What does my blood pressure reading mean? Blood pressure is classified into four stages. Based on your blood pressure reading, your health care provider may use the following stages to determine what type of treatment you need, if any. Systolic pressure and diastolic pressure are measured in a unit called mm Hg. Normal  Systolic pressure: below 120.  Diastolic pressure: below 80. Elevated  Systolic pressure: 120-129.  Diastolic pressure: below 80. Hypertension stage 1  Systolic pressure: 130-139.  Diastolic pressure: 80-89. Hypertension stage 2  Systolic pressure: 140 or above.  Diastolic pressure: 90 or above. What health risks are associated with  hypertension? Managing your hypertension is an important responsibility. Uncontrolled hypertension can lead to:  A heart attack.  A stroke.  A weakened blood vessel (aneurysm).  Heart failure.  Kidney damage.  Eye damage.  Metabolic syndrome.  Memory and concentration problems.  What changes can I make to manage my hypertension? Hypertension can be managed by making lifestyle changes and possibly by taking medicines. Your health care provider will help you make a plan to bring your blood pressure within a normal range. Eating and drinking  Eat a diet that is high in fiber and potassium, and low in salt (sodium), added sugar, and fat. An example eating plan is called the DASH (Dietary Approaches to Stop Hypertension) diet. To eat this way: ? Eat plenty of fresh fruits and vegetables. Try to fill half of your plate at each meal with fruits and vegetables. ? Eat whole grains, such as whole wheat pasta, brown rice, or whole grain bread. Fill about one quarter of your plate with whole grains. ? Eat low-fat diary products. ? Avoid fatty cuts of meat, processed or cured meats, and poultry with skin. Fill about one quarter of your plate with lean proteins such as fish, chicken without skin, beans, eggs, and tofu. ? Avoid premade and processed foods. These tend to be higher in sodium, added sugar, and fat.  Reduce your daily sodium intake. Most people with hypertension should eat less than 1,500 mg of sodium a day.  Limit alcohol intake to no more than 1 drink a day for nonpregnant women and 2 drinks a day for men. One drink equals 12  oz of beer, 5 oz of wine, or 1 oz of hard liquor. Lifestyle  Work with your health care provider to maintain a healthy body weight, or to lose weight. Ask what an ideal weight is for you.  Get at least 30 minutes of exercise that causes your heart to beat faster (aerobic exercise) most days of the week. Activities may include walking, swimming, or  biking.  Include exercise to strengthen your muscles (resistance exercise), such as weight lifting, as part of your weekly exercise routine. Try to do these types of exercises for 30 minutes at least 3 days a week.  Do not use any products that contain nicotine or tobacco, such as cigarettes and e-cigarettes. If you need help quitting, ask your health care provider.  Control any long-term (chronic) conditions you have, such as high cholesterol or diabetes. Monitoring  Monitor your blood pressure at home as told by your health care provider. Your personal target blood pressure may vary depending on your medical conditions, your age, and other factors.  Have your blood pressure checked regularly, as often as told by your health care provider. Working with your health care provider  Review all the medicines you take with your health care provider because there may be side effects or interactions.  Talk with your health care provider about your diet, exercise habits, and other lifestyle factors that may be contributing to hypertension.  Visit your health care provider regularly. Your health care provider can help you create and adjust your plan for managing hypertension. Will I need medicine to control my blood pressure? Your health care provider may prescribe medicine if lifestyle changes are not enough to get your blood pressure under control, and if:  Your systolic blood pressure is 130 or higher.  Your diastolic blood pressure is 80 or higher.  Take medicines only as told by your health care provider. Follow the directions carefully. Blood pressure medicines must be taken as prescribed. The medicine does not work as well when you skip doses. Skipping doses also puts you at risk for problems. Contact a health care provider if:  You think you are having a reaction to medicines you have taken.  You have repeated (recurrent) headaches.  You feel dizzy.  You have swelling in your  ankles.  You have trouble with your vision. Get help right away if:  You develop a severe headache or confusion.  You have unusual weakness or numbness, or you feel faint.  You have severe pain in your chest or abdomen.  You vomit repeatedly.  You have trouble breathing. Summary  Hypertension is when the force of blood pumping through your arteries is too strong. If this condition is not controlled, it may put you at risk for serious complications.  Your personal target blood pressure may vary depending on your medical conditions, your age, and other factors. For most people, a normal blood pressure is less than 120/80.  Hypertension is managed by lifestyle changes, medicines, or both. Lifestyle changes include weight loss, eating a healthy, low-sodium diet, exercising more, and limiting alcohol. This information is not intended to replace advice given to you by your health care provider. Make sure you discuss any questions you have with your health care provider. Document Released: 08/05/2012 Document Revised: 10/09/2016 Document Reviewed: 10/09/2016 Elsevier Interactive Patient Education  [employer]2018 [employer] Inc.

## 2017-12-01 ENCOUNTER — Ambulatory Visit (INDEPENDENT_AMBULATORY_CARE_PROVIDER_SITE_OTHER): Payer: No Typology Code available for payment source | Admitting: Family Medicine

## 2017-12-01 ENCOUNTER — Encounter: Payer: Self-pay | Admitting: Emergency Medicine

## 2017-12-01 ENCOUNTER — Encounter: Payer: Self-pay | Admitting: Family Medicine

## 2017-12-01 VITALS — BP 150/100 | HR 72 | Temp 98.0°F

## 2017-12-01 DIAGNOSIS — I10 Essential (primary) hypertension: Secondary | ICD-10-CM

## 2017-12-01 DIAGNOSIS — M25562 Pain in left knee: Secondary | ICD-10-CM

## 2017-12-01 DIAGNOSIS — M25561 Pain in right knee: Secondary | ICD-10-CM

## 2017-12-01 MED ORDER — LISINOPRIL 40 MG PO TABS: 40.0000 mg | ORAL_TABLET | Freq: Every day | ORAL | 3 refills | Status: DC

## 2017-12-01 MED ORDER — PREDNISONE 10 MG PO TABS: ORAL_TABLET | ORAL | 0 refills | Status: DC

## 2017-12-01 MED ORDER — HYDROCODONE-ACETAMINOPHEN 5-325 MG PO TABS: 1.0000 | ORAL_TABLET | Freq: Four times a day (QID) | ORAL | 0 refills | Status: DC | PRN

## 2017-12-01 NOTE — Patient Instructions (Signed)
Remember to contact your Orthopedist in the am.

## 2017-12-01 NOTE — Progress Notes (Signed)
Subjective:    Patient ID: Rose HightVeronica Annette Little, female    DOB: 10/09/1966, 52 y.o.   MRN: [id number]  No chief complaint on file.   HPI Patient was seen today for f/u and acute concern.    B/l knee pain, acute: -pt has a h/o DJD, followed by Ortho. -pt has tried steroid joint injections with no relief.  Has been advised that she needs b/l knee replacement. -pt in a wheelchair today as states she had a episode at work where her "legs lock up" and she can't move 2/2 to excruciating pain. -pt states these "episodes" have been going on for the last 2 yrs. -Pt endorses burning, tingling, numbness, "feeling like someone is chiseling at the bone and pouring alcohol in it" -Pt states she knows she needs knee replacement but cannot afford to miss work as she has to be able to pay her bills.  HTN: -pt on lisinopril 20 mg, but states she has been taking 2 pills daily. -pt has not been checking bp at home or work. -pt has stopped eating pork.   -Pt has increased vegetable intake, has been eating better overall, and drinking more water. -pt denies HAs, blurred vision, CP, SOB.   Past Medical History:  Diagnosis Date  . Arthritis   . Heart murmur   . Hypertension     No Known Allergies  ROS General: Denies fever, chills, night sweats, changes in weight, changes in appetite HEENT: Denies headaches, ear pain, changes in vision, rhinorrhea, sore throat CV: Denies CP, palpitations, SOB, orthopnea Pulm: Denies SOB, cough, wheezing GI: Denies abdominal pain, nausea, vomiting, diarrhea, constipation GU: Denies dysuria, hematuria, frequency, vaginal discharge Msk: Denies muscle cramps, joint pains  +b/l knee and leg pain, numbness, tingling Neuro: Denies weakness, numbness, tingling Skin: Denies rashes, bruising Psych: Denies depression, anxiety, hallucinations     Objective:    Blood pressure (!) 150/100, pulse 72, temperature 98 F (36.7 C), temperature source Oral.   Gen. Pleasant,  well-nourished, in moderate distress, tearful, in a wheelchair--typically ambulates without difficulty. HEENT: tearful, [place]/AT, face symmetric, conjunctiva clear, no scleral icterus, PERRLA, nares patent without drainage Lungs: no accessory muscle use, CTAB, no wheezes or rales Cardiovascular: RRR, no m/r/g, no peripheral edema Musculoskeletal: No deformities, b/l knees slightly warm to touch.  No TTP of L knee joint line, mild TTP on shin.  R knee TT light palpation-- pt unable to tolerate further palpation and movement of R knee/leg.  No effusion of either knee noted.  No cyanosis or clubbing, normal tone    Wt Readings from Last 3 Encounters:  10/13/17 239 lb (108.4 kg)  09/16/17 235 lb 6.4 oz (106.8 kg)  09/09/17 241 lb 12.8 oz (109.7 kg)    Lab Results  Component Value Date   WBC 9.4 07/04/2017   HGB 13.9 07/04/2017   HCT 41.9 07/04/2017   PLT 278 07/04/2017   GLUCOSE 140 (H) 09/09/2017   CHOL 192 09/09/2017   TRIG 236.0 (H) 09/09/2017   HDL 46.80 09/09/2017   LDLDIRECT 113.0 09/09/2017   ALT 12 07/04/2017   AST 17 07/04/2017   NA 140 09/09/2017   K 4.2 09/09/2017   CL 105 09/09/2017   CREATININE 0.68 09/09/2017   BUN 15 09/09/2017   CO2 24 09/09/2017   TSH 3.07 07/04/2017    Assessment/Plan:  Acute pain of both knees  -h/o b/l DJD in need of b/l knee replacement. -Given amount of pain pt advised to contact her Orthopedist.  Discussed  may not be able to continue prolonging surgery if having difficulty walking 2/2 pain. -Will try prednisone taper (will likely increase pt's fsbs) and limited supply of pain medication. - Plan: predniSONE (DELTASONE) 10 MG tablet, HYDROcodone-acetaminophen (NORCO) 5-325 MG tablet  Essential hypertension  -uncontrolled, but pain also contributing. -will increase lisinopril to 40 mg daily. -pt encouraged to start checking bp at home. -Pt congratulated on changes in diet, encouraged to continue. - Plan: lisinopril (PRINIVIL,ZESTRIL) 40  MG tablet -Pt given ED precautions.  F/u in 1 wk, sooner if needed.  Pt advised if pain and bp elevation continue she may need to proceed to the ED.   Abbe Amsterdam, MD

## 2017-12-15 ENCOUNTER — Other Ambulatory Visit: Payer: Self-pay | Admitting: Orthopedic Surgery

## 2017-12-15 DIAGNOSIS — M7989 Other specified soft tissue disorders: Secondary | ICD-10-CM

## 2017-12-18 ENCOUNTER — Ambulatory Visit
Admission: RE | Admit: 2017-12-18 | Discharge: 2017-12-18 | Disposition: A | Discharge status: Home / Self Care | Payer: No Typology Code available for payment source | Source: Ambulatory Visit | Attending: Orthopedic Surgery | Admitting: Orthopedic Surgery

## 2017-12-18 DIAGNOSIS — M7989 Other specified soft tissue disorders: Secondary | ICD-10-CM

## 2017-12-26 ENCOUNTER — Other Ambulatory Visit: Payer: Self-pay | Admitting: Orthopedic Surgery

## 2017-12-26 DIAGNOSIS — M545 Low back pain: Secondary | ICD-10-CM

## 2018-01-01 ENCOUNTER — Other Ambulatory Visit: Payer: No Typology Code available for payment source

## 2018-09-25 ENCOUNTER — Ambulatory Visit: Payer: BC Managed Care – PPO | Admitting: Family Medicine

## 2018-09-25 ENCOUNTER — Encounter: Payer: Self-pay | Admitting: Family Medicine

## 2018-09-25 DIAGNOSIS — I10 Essential (primary) hypertension: Secondary | ICD-10-CM | POA: Diagnosis not present

## 2018-09-25 MED ORDER — LISINOPRIL 40 MG PO TABS: 40.0000 mg | ORAL_TABLET | Freq: Every day | ORAL | 3 refills | Status: DC

## 2018-09-25 NOTE — Progress Notes (Signed)
Subjective:    Patient ID: Rose Little, female    DOB: 1965/11/26, 52 y.o.   MRN: [id number]  No chief complaint on file. pt accompanied by her boyfriend.  HPI Patient was seen today for f/u.  Pt states she has been off of her bp meds x months as she did not think she needed it.  BP was 200s/100s at a recent appt for possible knee surgery.  Pt notes bp has been 160s-180s systolic at home.  Pt has been eating more meat and less vegetables recently.  Past Medical History:  Diagnosis Date  . Arthritis   . Heart murmur   . Hypertension     No Known Allergies  ROS General: Denies fever, chills, night sweats, changes in weight, changes in appetite HEENT: Denies headaches, ear pain, changes in vision, rhinorrhea, sore throat CV: Denies CP, palpitations, SOB, orthopnea Pulm: Denies SOB, cough, wheezing GI: Denies abdominal pain, nausea, vomiting, diarrhea, constipation GU: Denies dysuria, hematuria, frequency, vaginal discharge Msk: Denies muscle cramps, joint pains Neuro: Denies weakness, numbness, tingling Skin: Denies rashes, bruising Psych: Denies depression, anxiety, hallucinations    Objective:    Blood pressure (!) 160/90, pulse 68, temperature 98.1 F (36.7 C), temperature source Oral, weight 245 lb (111.1 kg), SpO2 98 %.   Gen. Pleasant, well-nourished, in no distress, normal affect,   HEENT: purple hair, [place]/AT, face symmetric, no scleral icterus, PERRLA, nares patent without drainage Lungs: no accessory muscle use, CTAB, no wheezes or rales Cardiovascular: RRR, no m/r/g, no peripheral edema Neuro:  A&Ox3, CN II-XII intact, normal gait   Wt Readings from Last 3 Encounters:  09/25/18 245 lb (111.1 kg)  10/13/17 239 lb (108.4 kg)  09/16/17 235 lb 6.4 oz (106.8 kg)    Lab Results  Component Value Date   WBC 9.4 07/04/2017   HGB 13.9 07/04/2017   HCT 41.9 07/04/2017   PLT 278 07/04/2017   GLUCOSE 140 (H) 09/09/2017   CHOL 192 09/09/2017   TRIG 236.0 (H)  09/09/2017   HDL 46.80 09/09/2017   LDLDIRECT 113.0 09/09/2017   ALT 12 07/04/2017   AST 17 07/04/2017   NA 140 09/09/2017   K 4.2 09/09/2017   CL 105 09/09/2017   CREATININE 0.68 09/09/2017   BUN 15 09/09/2017   CO2 24 09/09/2017   TSH 3.07 07/04/2017    Assessment/Plan:  Essential hypertension  -uncontrolled -lifestyle modifications encouraged -pt to decrease sodium intake -continue checking bp at home - Plan: lisinopril (PRINIVIL,ZESTRIL) 40 MG tablet   F/u in 1 month  Abbe Amsterdam, MD

## 2018-09-25 NOTE — Patient Instructions (Signed)
Managing Your Hypertension Hypertension is commonly called high blood pressure. This is when the force of your blood pressing against the walls of your arteries is too strong. Arteries are blood vessels that carry blood from your heart throughout your body. Hypertension forces the heart to work harder to pump blood, and may cause the arteries to become narrow or stiff. Having untreated or uncontrolled hypertension can cause heart attack, stroke, kidney disease, and other problems. What are blood pressure readings? A blood pressure reading consists of a higher number over a lower number. Ideally, your blood pressure should be below 120/80. The first ("top") number is called the systolic pressure. It is a measure of the pressure in your arteries as your heart beats. The second ("bottom") number is called the diastolic pressure. It is a measure of the pressure in your arteries as the heart relaxes. What does my blood pressure reading mean? Blood pressure is classified into four stages. Based on your blood pressure reading, your health care provider may use the following stages to determine what type of treatment you need, if any. Systolic pressure and diastolic pressure are measured in a unit called mm Hg. Normal  Systolic pressure: below 120.  Diastolic pressure: below 80. Elevated  Systolic pressure: 120-129.  Diastolic pressure: below 80. Hypertension stage 1  Systolic pressure: 130-139.  Diastolic pressure: 80-89. Hypertension stage 2  Systolic pressure: 140 or above.  Diastolic pressure: 90 or above. What health risks are associated with hypertension? Managing your hypertension is an important responsibility. Uncontrolled hypertension can lead to:  A heart attack.  A stroke.  A weakened blood vessel (aneurysm).  Heart failure.  Kidney damage.  Eye damage.  Metabolic syndrome.  Memory and concentration problems.  What changes can I make to manage my  hypertension? Hypertension can be managed by making lifestyle changes and possibly by taking medicines. Your health care provider will help you make a plan to bring your blood pressure within a normal range. Eating and drinking  Eat a diet that is high in fiber and potassium, and low in salt (sodium), added sugar, and fat. An example eating plan is called the DASH (Dietary Approaches to Stop Hypertension) diet. To eat this way: ? Eat plenty of fresh fruits and vegetables. Try to fill half of your plate at each meal with fruits and vegetables. ? Eat whole grains, such as whole wheat pasta, brown rice, or whole grain bread. Fill about one quarter of your plate with whole grains. ? Eat low-fat diary products. ? Avoid fatty cuts of meat, processed or cured meats, and poultry with skin. Fill about one quarter of your plate with lean proteins such as fish, chicken without skin, beans, eggs, and tofu. ? Avoid premade and processed foods. These tend to be higher in sodium, added sugar, and fat.  Reduce your daily sodium intake. Most people with hypertension should eat less than 1,500 mg of sodium a day.  Limit alcohol intake to no more than 1 drink a day for nonpregnant women and 2 drinks a day for men. One drink equals 12 oz of beer, 5 oz of wine, or 1 oz of hard liquor. Lifestyle  Work with your health care provider to maintain a healthy body weight, or to lose weight. Ask what an ideal weight is for you.  Get at least 30 minutes of exercise that causes your heart to beat faster (aerobic exercise) most days of the week. Activities may include walking, swimming, or biking.  Include exercise   to strengthen your muscles (resistance exercise), such as weight lifting, as part of your weekly exercise routine. Try to do these types of exercises for 30 minutes at least 3 days a week.  Do not use any products that contain nicotine or tobacco, such as cigarettes and e-cigarettes. If you need help quitting, ask  your health care provider.  Control any long-term (chronic) conditions you have, such as high cholesterol or diabetes. Monitoring  Monitor your blood pressure at home as told by your health care provider. Your personal target blood pressure may vary depending on your medical conditions, your age, and other factors.  Have your blood pressure checked regularly, as often as told by your health care provider. Working with your health care provider  Review all the medicines you take with your health care provider because there may be side effects or interactions.  Talk with your health care provider about your diet, exercise habits, and other lifestyle factors that may be contributing to hypertension.  Visit your health care provider regularly. Your health care provider can help you create and adjust your plan for managing hypertension. Will I need medicine to control my blood pressure? Your health care provider may prescribe medicine if lifestyle changes are not enough to get your blood pressure under control, and if:  Your systolic blood pressure is 130 or higher.  Your diastolic blood pressure is 80 or higher.  Take medicines only as told by your health care provider. Follow the directions carefully. Blood pressure medicines must be taken as prescribed. The medicine does not work as well when you skip doses. Skipping doses also puts you at risk for problems. Contact a health care provider if:  You think you are having a reaction to medicines you have taken.  You have repeated (recurrent) headaches.  You feel dizzy.  You have swelling in your ankles.  You have trouble with your vision. Get help right away if:  You develop a severe headache or confusion.  You have unusual weakness or numbness, or you feel faint.  You have severe pain in your chest or abdomen.  You vomit repeatedly.  You have trouble breathing. Summary  Hypertension is when the force of blood pumping through  your arteries is too strong. If this condition is not controlled, it may put you at risk for serious complications.  Your personal target blood pressure may vary depending on your medical conditions, your age, and other factors. For most people, a normal blood pressure is less than 120/80.  Hypertension is managed by lifestyle changes, medicines, or both. Lifestyle changes include weight loss, eating a healthy, low-sodium diet, exercising more, and limiting alcohol. This information is not intended to replace advice given to you by your health care provider. Make sure you discuss any questions you have with your health care provider. Document Released: 08/05/2012 Document Revised: 10/09/2016 Document Reviewed: 10/09/2016 Elsevier Interactive Patient Education  2018 [employer] Inc.  

## 2018-10-06 ENCOUNTER — Telehealth: Payer: Self-pay

## 2018-10-12 ENCOUNTER — Encounter: Payer: Self-pay | Admitting: Family Medicine

## 2018-10-12 ENCOUNTER — Ambulatory Visit: Payer: BC Managed Care – PPO | Admitting: Family Medicine

## 2018-10-12 VITALS — BP 128/82 | HR 70 | Temp 98.1°F | Wt 242.0 lb

## 2018-10-12 DIAGNOSIS — I10 Essential (primary) hypertension: Secondary | ICD-10-CM

## 2018-10-12 DIAGNOSIS — M62830 Muscle spasm of back: Secondary | ICD-10-CM | POA: Diagnosis not present

## 2018-10-12 DIAGNOSIS — R0683 Snoring: Secondary | ICD-10-CM

## 2018-10-12 DIAGNOSIS — M5432 Sciatica, left side: Secondary | ICD-10-CM

## 2018-10-12 LAB — BASIC METABOLIC PANEL
BUN: 14 mg/dL (ref 6–23)
CALCIUM: 9.7 mg/dL (ref 8.4–10.5)
CO2: 28 mEq/L (ref 19–32)
Chloride: 104 mEq/L (ref 96–112)
Creatinine, Ser: 0.72 mg/dL (ref 0.40–1.20)
GFR: 108.67 mL/min (ref >60.00)
GLUCOSE: 97 mg/dL (ref 70–99)
POTASSIUM: 4.6 meq/L (ref 3.5–5.1)
SODIUM: 139 meq/L (ref 135–145)

## 2018-10-12 MED ORDER — CYCLOBENZAPRINE HCL 5 MG PO TABS: 5.0000 mg | ORAL_TABLET | Freq: Three times a day (TID) | ORAL | 1 refills | Status: DC | PRN

## 2018-10-12 NOTE — Patient Instructions (Signed)
Muscle Cramps and Spasms Muscle cramps and spasms occur when a muscle or muscles tighten and you have no control over this tightening (involuntary muscle contraction). They are a common problem and can develop in any muscle. The most common place is in the calf muscles of the leg. Muscle cramps and muscle spasms are both involuntary muscle contractions, but there are some differences between the two:  Muscle cramps are painful. They come and go and may last a few seconds to 15 minutes. Muscle cramps are often more forceful and last longer than muscle spasms.  Muscle spasms may or may not be painful. They may also last just a few seconds or much longer.  Certain medical conditions, such as diabetes or Parkinson disease, can make it more likely to develop cramps or spasms. However, cramps or spasms are usually not caused by a serious underlying problem. Common causes include:  Overexertion.  Overuse from repetitive motions, or doing the same thing over and over.  Remaining in a certain position for a long period of time.  Improper preparation, form, or technique while playing a sport or doing an activity.  Dehydration.  Injury.  Side effects of some medicines.  Abnormally low levels of the salts and ions in your blood (electrolytes), especially potassium and calcium. This could happen if you are taking water pills (diuretics) or if you are pregnant.  In many cases, the cause of muscle cramps or spasms is unknown. Follow these instructions at home:  Stay well hydrated. Drink enough fluid to keep your urine clear or pale yellow.  Try massaging, stretching, and relaxing the affected muscle.  If directed, apply heat to tight or tense muscles as often as told by your health care provider. Use the heat source that your health care provider recommends, such as a moist heat pack or a heating pad. ? Place a towel between your skin and the heat source. ? Leave the heat on for 20-30  minutes. ? Remove the heat if your skin turns bright red. This is especially important if you are unable to feel pain, heat, or cold. You may have a greater risk of getting burned.  If directed, put ice on the affected area. This may help if you are sore or have pain after a cramp or spasm. ? Put ice in a plastic bag. ? Place a towel between your skin and the bag. ? Leavethe ice on for 20 minutes, 2-3 times a day.  Take over-the-counter and prescription medicines only as told by your health care provider.  Pay attention to any changes in your symptoms. Contact a health care provider if:  Your cramps or spasms get more severe or happen more often.  Your cramps or spasms do not improve over time. This information is not intended to replace advice given to you by your health care provider. Make sure you discuss any questions you have with your health care provider. Document Released: 05/03/2002 Document Revised: 12/13/2015 Document Reviewed: 08/15/2015 Elsevier Interactive Patient Education  2018 [employer]  

## 2018-10-12 NOTE — Progress Notes (Signed)
Subjective:    Patient ID: Rose HightVeronica Annette Little, female    DOB: 12/02/1965, 52 y.o.   MRN: [id number]  No chief complaint on file.   HPI Patient was seen today for acute concern.    Muscle spasms: -L leg and R side of back  -x3 days. -feels like "a crowbar is digging in back".  Keeps her up at night -pain goes down posterior leg to calf. -denies calf edema, redness. 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  Pt is a school bus driver. -denies heavy lifting, pushing, or pulling, loss of bowel or bladder, numbness or tingling in LEs b/l.  Has "egg crate" foam on top of her mattress.   Pt asks about sleep study. Has a h/o snoring, HTN.  Taking lisinopril 40 mg daily.  Denies daytime somnolence or having to take naps.  Past Medical History:  Diagnosis Date  . Arthritis   . Heart murmur   . Hypertension     No Known Allergies  ROS General: Denies fever, chills, night sweats, changes in weight, changes in appetite HEENT: Denies headaches, ear pain, changes in vision, rhinorrhea, sore throat CV: Denies CP, palpitations, SOB, orthopnea Pulm: Denies SOB, cough, wheezing GI: Denies abdominal pain, nausea, vomiting, diarrhea, constipation GU: Denies dysuria, hematuria, frequency, vaginal discharge Msk: Denies joint pains  +muscle cramps/back spasms Neuro: Denies weakness, numbness, tingling Skin: Denies rashes, bruising Psych: Denies depression, anxiety, hallucinations    Objective:    Blood pressure 128/82, pulse 70, temperature 98.1 F (36.7 C), temperature source Oral, weight 242 lb (109.8 kg), SpO2 96 %.   Gen. Pleasant, well-nourished, in no distress, normal affect   HEENT: [place]/AT, face symmetric, no scleral icterus, PERRLA, nares patent without drainage, pharynx without erythema or exudate. Lungs: no accessory muscle use Cardiovascular: RRR,  no peripheral edema Musculoskeletal: No TTP of cervical, thoracic, or lumbar spine  or paraspinal muscles.  No pain with flexion of lumbar spine.  No deformities, no cyanosis or clubbing, normal tone Neuro:  A&Ox3, CN II-XII intact, normal gait  Wt Readings from Last 3 Encounters:  09/25/18 245 lb (111.1 kg)  10/13/17 239 lb (108.4 kg)  09/16/17 235 lb 6.4 oz (106.8 kg)    Lab Results  Component Value Date   WBC 9.4 07/04/2017   HGB 13.9 07/04/2017   HCT 41.9 07/04/2017   PLT 278 07/04/2017   GLUCOSE 140 (H) 09/09/2017   CHOL 192 09/09/2017   TRIG 236.0 (H) 09/09/2017   HDL 46.80 09/09/2017   LDLDIRECT 113.0 09/09/2017   ALT 12 07/04/2017   AST 17 07/04/2017   NA 140 09/09/2017   K 4.2 09/09/2017   CL 105 09/09/2017   CREATININE 0.68 09/09/2017   BUN 15 09/09/2017   CO2 24 09/09/2017   TSH 3.07 07/04/2017    Assessment/Plan:  Spasm of muscle of lower back  -discussed stretching, massage, heat, Tylenol prn -given handout -advised mattress may be too soft. - Plan: Basic metabolic panel, cyclobenzaprine (FLEXERIL) 5 MG tablet  Sciatica of left side  - Plan: Basic metabolic panel  Essential hypertension  -controlled -continue lisinopril 40 mg daily -continue lifestyle modifications - Plan: Basic metabolic panel  Snoring  - Plan: Ambulatory referral to Pulmonology  F/u prn  Abbe AmsterdamShannon Shiquita Collignon, MD

## 2018-10-20 NOTE — Telephone Encounter (Signed)
A referral to Pulmonary has been placed.

## 2018-10-26 ENCOUNTER — Ambulatory Visit: Payer: BC Managed Care – PPO | Admitting: Family Medicine

## 2018-10-28 ENCOUNTER — Ambulatory Visit: Payer: BC Managed Care – PPO | Admitting: Family Medicine

## 2018-11-13 ENCOUNTER — Ambulatory Visit: Payer: BC Managed Care – PPO | Admitting: Family Medicine

## 2018-11-13 DIAGNOSIS — Z0289 Encounter for other administrative examinations: Secondary | ICD-10-CM

## 2018-11-16 ENCOUNTER — Institutional Professional Consult (permissible substitution): Payer: BC Managed Care – PPO | Admitting: Internal Medicine

## 2018-11-26 ENCOUNTER — Ambulatory Visit (INDEPENDENT_AMBULATORY_CARE_PROVIDER_SITE_OTHER): Payer: BC Managed Care – PPO | Admitting: Pulmonary Disease

## 2018-11-26 ENCOUNTER — Encounter: Payer: Self-pay | Admitting: Pulmonary Disease

## 2018-11-26 VITALS — BP 132/88 | HR 74 | Ht 65.0 in | Wt 240.0 lb

## 2018-11-26 DIAGNOSIS — R0683 Snoring: Secondary | ICD-10-CM

## 2018-11-26 NOTE — Progress Notes (Signed)
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 cyclobenzaprine (FLEXERIL) 5 MG tablet Take 1 tablet (5 mg total) by mouth 3 (three) times daily as needed for muscle spasms.  Marland Kitchen. lisinopril (PRINIVIL,ZESTRIL) 40 MG tablet Take 1 tablet (40 mg total) by mouth daily.   No facility-administered encounter medications on file as of 11/26/2018.     Allergies as of 11/26/2018  . (No Known Allergies)    Past Medical History:  Diagnosis Date  . Arthritis   . Heart murmur   . Hypertension     Past Surgical History:  Procedure Laterality Date  . ABDOMINAL HYSTERECTOMY    . CARDIAC SURGERY    . KNEE SURGERY Right     Family History  Problem Relation Age of Onset  . Alcohol abuse Mother   . Stroke Mother   . Alcohol abuse Father   . Stroke Father   . Arthritis Maternal Grandmother   . Heart disease Maternal Grandmother   . Stroke Maternal Grandmother   . Hypertension Maternal Grandmother     Social History   Socioeconomic History  . Marital status: Single    Spouse name: Not on file  . Number of children: Not on file  . Years of education: Not on file  . Highest  education level: Not on file  Occupational History  . Not on file  Social Needs  . Financial resource strain: Not on file  . Food insecurity:    Worry: Not on file    Inability: Not on file  . Transportation needs:    Medical: Not on file    Non-medical: Not on file  Tobacco Use  . Smoking status: Current Every Day Smoker    Packs/day: 4.00    Years: 20.00    Pack years: 80.00    Types: Cigarettes  . Smokeless tobacco: Never Used  Substance and Sexual Activity  . Alcohol use: Yes    Comment: 2 cans beer/day  . Drug use: No  . Sexual activity: Not on file  Lifestyle  . Physical activity:    Days per week: Not on file    Minutes per session: Not on file  . Stress: Not on file  Relationships  . Social connections:    Talks on phone: Not on file    Gets together: Not on file    Attends religious service: Not on file    Active member of club or organization: Not on file    Attends meetings of clubs or organizations: Not on file    Relationship status: Not on file  . Intimate partner violence:  Fear of current or ex partner: Not on file    Emotionally abused: Not on file    Physically abused: Not on file    Forced sexual activity: Not on file  Other Topics Concern  . Not on file  Social History Narrative  . Not on file    Review of Systems  Constitutional: Negative for fatigue.  HENT: Negative.   Eyes: Negative.   Respiratory: Negative for cough, shortness of breath, wheezing and stridor.   Cardiovascular: Negative.  Negative for chest pain and leg swelling.  Gastrointestinal: Negative.   Endocrine: Negative.   Psychiatric/Behavioral: Positive for sleep disturbance.    Vitals:   11/26/18 1055  BP: 132/88  Pulse: 74  SpO2: 100%     Physical Exam  Constitutional: She appears well-developed and well-nourished.  HENT:  Head: Normocephalic and atraumatic.  Mallampati 3, crowded oropharynx  Eyes: Pupils are equal, round, and reactive to light. Conjunctivae  and EOM are normal. Right eye exhibits no discharge. Left eye exhibits no discharge.  Neck: Normal range of motion. Neck supple. No tracheal deviation present. No thyromegaly present.  Cardiovascular: Normal rate and regular rhythm.  Pulmonary/Chest: Effort normal and breath sounds normal. No respiratory distress. She has no wheezes. She has no rales. She exhibits no tenderness.  Abdominal: Soft. Bowel sounds are normal. She exhibits no distension. There is no abdominal tenderness.   Results of the Epworth flowsheet 11/26/2018  Sitting and reading 0  Watching TV 3  Sitting, inactive in a public place (e.g. a theatre or a meeting) 2  As a passenger in a car for an hour without a break 0  Lying down to rest in the afternoon when circumstances permit 3  Sitting and talking to someone 2  Sitting quietly after a lunch without alcohol 0  In a car, while stopped for a few minutes in traffic 0  Total score 10   Assessment:  History of significant snoring -Associated daytime sleepiness  Excessive daytime sleepiness  Moderate probability of significant sleep disordered breathing Active smoker Obesity Hypertension  Plan/Recommendations: We will schedule patient for split-night study Importance of exercise and weight loss discussed with the patient Pathophysiology of sleep disordered breathing discussed with patient Treatment options of sleep disordered breathing discussed with the patient Smoking cessation counseling    Virl Diamond MD [facility] 11/26/2018, 11:16 AM  CC: Deeann Saint, MD

## 2018-11-26 NOTE — Patient Instructions (Addendum)
Moderate probability of obstructive sleep apnea  We will schedule you for a split-night study We will see you back in the office in about 3 months  We will update you as soon as results are available  Sleep Apnea Sleep apnea affects breathing during sleep. It causes breathing to stop for a short time or to become shallow. It can also increase the risk of:  Heart attack.  Stroke.  Being very overweight (obese).  Diabetes.  Heart failure.  Irregular heartbeat. The goal of treatment is to help you breathe normally again. What are the causes? There are three kinds of sleep apnea:  Obstructive sleep apnea. This is caused by a blocked or collapsed airway.  Central sleep apnea. This happens when the brain does not send the right signals to the muscles that control breathing.  Mixed sleep apnea. This is a combination of obstructive and central sleep apnea. The most common cause of this condition is a collapsed or blocked airway. This can happen if:  Your throat muscles are too relaxed.  Your tongue and tonsils are too large.  You are overweight.  Your airway is too small. What increases the risk?  Being overweight.  Smoking.  Having a small airway.  Being older.  Being female.  Drinking alcohol.  Taking medicines to calm yourself (sedatives or tranquilizers).  Having family members with the condition. What are the signs or symptoms?  Trouble staying asleep.  Being sleepy or tired during the day.  Getting angry a lot.  Loud snoring.  Headaches in the morning.  Not being able to focus your mind (concentrate).  Forgetting things.  Less interest in sex.  Mood swings.  Personality changes.  Feelings of sadness (depression).  Waking up a lot during the night to pee (urinate).  Dry mouth.  Sore throat. How is this diagnosed?  Your medical history.  A physical exam.  A test that is done when you are sleeping (sleep study). The test is most  often done in a sleep lab but may also be done at home. How is this treated?   Sleeping on your side.  Using a medicine to get rid of mucus in your nose (decongestant).  Avoiding the use of alcohol, medicines to help you relax, or certain pain medicines (narcotics).  Losing weight, if needed.  Changing your diet.  Not smoking.  Using a machine to open your airway while you sleep, such as: ? An oral appliance. This is a mouthpiece that shifts your lower jaw forward. ? A CPAP device. This device blows air through a mask when you breathe out (exhale). ? An EPAP device. This has valves that you put in each nostril. ? A BPAP device. This device blows air through a mask when you breathe in (inhale) and breathe out.  Having surgery if other treatments do not work. It is important to get treatment for sleep apnea. Without treatment, it can lead to:  High blood pressure.  Coronary artery disease.  In men, not being able to have an erection (impotence).  Reduced thinking ability. Follow these instructions at home: Lifestyle  Make changes that your doctor recommends.  Eat a healthy diet.  Lose weight if needed.  Avoid alcohol, medicines to help you relax, and some pain medicines.  Do not use any products that contain nicotine or tobacco, such as cigarettes, e-cigarettes, and chewing tobacco. If you need help quitting, ask your doctor. General instructions  Take over-the-counter and prescription medicines only as told by  your doctor.  If you were given a machine to use while you sleep, use it only as told by your doctor.  If you are having surgery, make sure to tell your doctor you have sleep apnea. You may need to bring your device with you.  Keep all follow-up visits as told by your doctor. This is important. Contact a doctor if:  The machine that you were given to use during sleep bothers you or does not seem to be working.  You do not get better.  You get  worse. Get help right away if:  Your chest hurts.  You have trouble breathing in enough air.  You have an uncomfortable feeling in your back, arms, or stomach.  You have trouble talking.  One side of your body feels weak.  A part of your face is hanging down. These symptoms may be an emergency. Do not wait to see if the symptoms will go away. Get medical help right away. Call your local emergency services (911 in the [place] Do not drive yourself to the hospital. Summary  This condition affects breathing during sleep.  The most common cause is a collapsed or blocked airway.  The goal of treatment is to help you breathe normally while you sleep. This information is not intended to replace advice given to you by your health care provider. Make sure you discuss any questions you have with your health care provider. Document Released: 08/20/2008 Document Revised: 07/07/2018 Document Reviewed: 07/07/2018 Elsevier Interactive Patient Education  [employer]2019 Elsevier Inc[employer]

## 2018-12-08 ENCOUNTER — Ambulatory Visit: Payer: BC Managed Care – PPO | Attending: Pulmonary Disease | Admitting: Pulmonary Disease

## 2018-12-08 DIAGNOSIS — G4733 Obstructive sleep apnea (adult) (pediatric): Secondary | ICD-10-CM | POA: Diagnosis not present

## 2018-12-08 DIAGNOSIS — R0683 Snoring: Secondary | ICD-10-CM | POA: Insufficient documentation

## 2018-12-10 ENCOUNTER — Telehealth: Payer: Self-pay | Admitting: Pulmonary Disease

## 2018-12-10 NOTE — Procedures (Signed)
POLYSOMNOGRAPHY  Last, First: Rose Little, Rose Little MRN: [id number] Gender: Female Age (years): 2 Weight (lbs): 240 DOB: 01/31/1966 BMI: 40 Primary Care: No PCP Epworth Score: 5 Referring: Laurin Coder MD Technician: Rosebud Poles Interpreting: Laurin Coder MD Study Type: Split Night CPAP Ordered Study Type: Split Night CPAP Study date: 12/08/2018 Location: [facility] CLINICAL INFORMATION Rose Little is a 53 year old Female and was referred to the sleep center for evaluation of N/A. Indications include Snoring.  MEDICATIONS Patient self administered medications include: N/A. Medications administered during study include No sleep medicine administered.  SLEEP STUDY TECHNIQUE The patient underwent an attended overnight level one polysomnography titration to assess the effects of CPAP therapy. The following variables were monitored: EEG (C4-A1, C3-A2, O1-A2, O2-A1), EOG, submental and leg EMG, ECG, oxyhemoglobin saturation by pulse oximetry, thoracic and abdominal respiratory effort belts, nasal/oral airflow by pressure sensor, body position sensor and snoring sensor. CPAP pressure was titrated to eliminate apneas, hypopneas and oxygen desaturation. Hypopneas were scored per AASM definition IB (4% desaturation)  The NPSG portion of the study ended at 1:29:27 AM . The CPAP titration was initiated at 1:33:51 AM AM with the CPAP portion of the study ending at 5:32:03 AM.  TECHNICIAN COMMENTS Comments added by Technician: Patient met Split night protocol with an AHI of 46/hr within 2-3 hrs of TST. CPAP therapy started at 4 cm of H20 and increased to 12 cm of H20 due events and mainly to patients' snoring. Patient tolerated therapy very well. Suboptimal pressure obtained because REM supine was not observed, patient state she could not sleep on her back. Break through snoring noticed at times during CPAP therapy Comments added by Scorer: N/A SLEEP ARCHITECTURE The recording time for  the entire night was 416.6 minutes. The diagnostic portion was initiated at 10:35:24 PM and terminated at 1:29:27 AM. The time in bed was 174.0 minutes. EEG confirmed total sleep time was 144.5 minutes yielding a sleep efficiency of 83.0%%. Sleep onset after lights out was 20.7 minutes with a REM latency of 71.5 minutes. The patient spent 6.2%% of the night in stage N1 sleep, 54.0%% in stage N2 sleep, 23.2%% in stage N3 and 16.6% in REM. The Arousal Index was 7.5/hour.  The titration portion was initiated at 1:33:51 AM and terminated at 5:32:03 AM. The time in bed was 238.2 minutes. EEG confirmed total sleep time was 222.9 minutes yielding a sleep efficiency of 93.6%%. Sleep onset after CPAP initiation was 9.3 minutes with a REM latency of 40.0 minutes. The patient spent 3.4%% of the night in stage N1 sleep, 52.7%% in stage N2 sleep, 8.7%% in stage N3 and 35.2% in REM. The Arousal Index was 2.2/hour. RESPIRATORY PARAMETERS During the diagnostic portion, there were a total of 111 respiratory disturbances recorded; 86 apneas ( 85 obstructive, 0 mixed, 1 central), 25 hypopneas and 0 RERAs. The apnea/hypopnea index 46.1 was events/hour and the RDI was 46.1 events/hour. The central sleep apnea index was 0.4 events/hour. The REM AHI was 77.5/h and NREM AHI was 39.8/h. The REM RDI was 77.5/h and NREM RDI was 39.8/h. The supine AHI was N/A/h, and the non supine AHI was 46.1/h; supine during 0.0%% of sleep. The supine RDI was 0.0 /h, and the non supine RDI was 46.09/h. Respiratory disturbances were associated with oxygen desaturation down to a nadir of 73.0% during sleep. The mean oxygen saturation during the study was 96.1%. The cumulative time under 88% oxygen saturation was 4.2 minutes.  During the titration portion, the apnea/hypopnea index (  AHI) was 1.3 events/hour and the RDI was 1.3 events/hour. The central sleep apnea index was events/hour. The most appropriate setting of CPAP was not determined. Respiratory  events persisted at all settings. Supplemental oxygen was not administered during the study. LEG MOVEMENT DATA The periodic limb movement index was 0.0/hour with an associated arousal index of /hour. CARDIAC DATA The underlying cardiac rhythm was most consistent with sinus rhythm. Mean heart rate was 67.7 during diagnostic portion and 61.2 during titration portion of study. Additional rhythm abnormalities include None.   IMPRESSIONS - Severe Obstructive Sleep apnea(OSA) Sub-Optimal pressure attained. - EKG showed no cardiac abnormalities. - No Significant Central Sleep Apnea (CSA) - Severe Oxygen Desaturation - The patient snored with loud snoring volume. - EEG did not show alpha intrusion. - No significant periodic leg movements(PLMs) during sleep. However, no significant associated arousals. - Reduced sleep efficiency, long primary sleep latency, short REM sleep latency and normal slow wave latency.   DIAGNOSIS - Obstructive Sleep Apnea (327.23 [G47.33 ICD-10]) - Nocturnal Hypoxemia (327.26 [G47.36 ICD-10])   RECOMMENDATIONS - Recommend auto titrating CPAP settings of 5-15 with close clinical follow-up - Avoid alcohol, sedatives and other CNS depressants that may worsen sleep apnea and disrupt normal sleep architecture. - Sleep hygiene should be reviewed to assess factors that may improve sleep quality. - Weight management and regular exercise should be initiated or continued. - Return to Sleep Center for re-evaluation after 4 weeks of therapy  [Electronically signed] 12/10/2018 08:46 PM  Sherrilyn Rist MD NPI: [id number]

## 2018-12-10 NOTE — Telephone Encounter (Signed)
Sleep study result  Date of study 12/08/2018  DME referral, patient's choice of mask  Recommend auto titrating CPAP settings of 5-15 with close clinical follow-up

## 2018-12-11 NOTE — Telephone Encounter (Signed)
Lmom to call back. 

## 2018-12-15 ENCOUNTER — Telehealth: Payer: Self-pay | Admitting: Pulmonary Disease

## 2018-12-15 DIAGNOSIS — G4733 Obstructive sleep apnea (adult) (pediatric): Secondary | ICD-10-CM

## 2018-12-15 NOTE — Telephone Encounter (Signed)
Spoke with pt and gave her a copy of the sleep report. 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 I advised her of the results and will place order for CPAP. Pt understood and nothing further is needed.    Tomma Lightning, MD      8:50 PM  Note    Sleep study result  Date of study 12/08/2018  DME referral, patient's choice of mask  Recommend auto titrating CPAP settings of 5-15 with close clinical follow-up

## 2018-12-15 NOTE — Telephone Encounter (Signed)
Order placed for CPAP.

## 2018-12-15 NOTE — Telephone Encounter (Signed)
Advised pt of results. Pt understood and nothing further is needed.   

## 2019-02-05 ENCOUNTER — Other Ambulatory Visit: Payer: Self-pay

## 2019-02-05 ENCOUNTER — Ambulatory Visit: Payer: BC Managed Care – PPO | Admitting: Family Medicine

## 2019-02-05 ENCOUNTER — Encounter: Payer: Self-pay | Admitting: Family Medicine

## 2019-02-05 VITALS — BP 136/78 | HR 86 | Temp 98.8°F | Wt 236.0 lb

## 2019-02-05 DIAGNOSIS — K047 Periapical abscess without sinus: Secondary | ICD-10-CM | POA: Diagnosis not present

## 2019-02-05 DIAGNOSIS — I10 Essential (primary) hypertension: Secondary | ICD-10-CM

## 2019-02-05 DIAGNOSIS — K029 Dental caries, unspecified: Secondary | ICD-10-CM

## 2019-02-05 DIAGNOSIS — K0889 Other specified disorders of teeth and supporting structures: Secondary | ICD-10-CM | POA: Diagnosis not present

## 2019-02-05 MED ORDER — TRAMADOL HCL 50 MG PO TABS: 50.0000 mg | ORAL_TABLET | Freq: Three times a day (TID) | ORAL | 0 refills | Status: AC | PRN

## 2019-02-05 MED ORDER — AMOXICILLIN 500 MG PO CAPS: 500.0000 mg | ORAL_CAPSULE | Freq: Two times a day (BID) | ORAL | 0 refills | Status: AC

## 2019-02-05 MED ORDER — LISINOPRIL 40 MG PO TABS: 40.0000 mg | ORAL_TABLET | Freq: Every day | ORAL | 1 refills | Status: AC

## 2019-02-05 NOTE — Progress Notes (Signed)
Subjective:    Patient ID: Rose Little, female    DOB: July 13, 1966, 53 y.o.   MRN: [id number]  No chief complaint on file.   HPI Patient was seen today for an acute concern.  Pt states she was doing well lisinopril 40 mg.  Feels good overall.,  Blurred vision, chest pain, dry cough.  Pt also notes tooth pain.  Pt states she was seen by her dentist concerned advised her not on the left molar handicap backslash was cracked per x-rays.  Pt needs to have a tooth extracted but states he cannot afford that this month.  Pt endorses pain with chewing and at night while trying to sleep.  Pt also notes swelling in face and lymph nodes are benign.  Past Medical History:  Diagnosis Date  . Arthritis   . Heart murmur   . Hypertension     No Known Allergies  ROS General: Denies fever, chills, night sweats, changes in weight, changes in appetite HEENT: Denies headaches, ear pain, changes in vision, rhinorrhea, sore throat  +tooth pain CV: Denies CP, palpitations, SOB, orthopnea Pulm: Denies SOB, cough, wheezing GI: Denies abdominal pain, nausea, vomiting, diarrhea, constipation GU: Denies dysuria, hematuria, frequency, vaginal discharge Msk: Denies muscle cramps, joint pains Neuro: Denies weakness, numbness, tingling Skin: Denies rashes, bruising Psych: Denies depression, anxiety, hallucinations    Objective:    Blood pressure 136/78, pulse 86, temperature 98.8 F (37.1 C), temperature source Oral, weight 236 lb (107 kg).   Gen. Pleasant, well-nourished, in no distress, normal affect   HEENT: [place]/AT, face symmetric, no scleral icterus, PERRLA, nares patent without drainage,  Dental caries noted.  Pharynx without erythema or exudate.  Cervical lymphadenopathy Lungs: no accessory muscle use, CTAB, no wheezes or rales Cardiovascular: RRR, no m/r/g, no peripheral edema Neuro:  A&Ox3, CN II-XII intact, normal gait  Wt Readings from Last 3 Encounters:  11/26/18 240 lb (108.9 kg)   10/12/18 242 lb (109.8 kg)  09/25/18 245 lb (111.1 kg)    Lab Results  Component Value Date   WBC 9.4 07/04/2017   HGB 13.9 07/04/2017   HCT 41.9 07/04/2017   PLT 278 07/04/2017   GLUCOSE 97 10/12/2018   CHOL 192 09/09/2017   TRIG 236.0 (H) 09/09/2017   HDL 46.80 09/09/2017   LDLDIRECT 113.0 09/09/2017   ALT 12 07/04/2017   AST 17 07/04/2017   NA 139 10/12/2018   K 4.6 10/12/2018   CL 104 10/12/2018   CREATININE 0.72 10/12/2018   BUN 14 10/12/2018   CO2 28 10/12/2018   TSH 3.07 07/04/2017    Assessment/Plan:  Essential hypertension -improving -continue lisinopril 40 mg daliy -Discussed lifestyle modifications  Infected dental carries  -Patient advised to follow-up with dentistry -Given list of area clinics that provide low-cost care - Plan: amoxicillin (AMOXIL) 500 MG capsule  Tooth ache  -Patient given temporary Rx for tramadol -Advised to follow-up with dental for tooth extraction. - Plan: traMADol (ULTRAM) 50 MG tablet  F/u prn  Abbe Amsterdam, MD

## 2019-02-05 NOTE — Patient Instructions (Signed)
Dental Caries  Dental caries are spots of decay (cavities) in teeth. They are in the outer layer of your tooth (enamel). Treat them as soon as you can. If they are not treated, they can spread decay and lead to painful infection. Follow these instructions at home: General instructions  Take good care of your mouth and teeth. This keeps them healthy. ? Brush your teeth 2 times a day. Use toothpaste with fluoride in it. ? Floss your teeth once a day.  If your dentist prescribed an antibiotic medicine to treat an infection, take it as told. Do not stop taking the antibiotic even if your condition gets better.  Keep all follow-up visits as told by your dentist. This is important. This includes all cleanings. Preventing dental caries   Brush your teeth every morning and night. Use fluoride toothpaste.  Get regular dental cleanings.  If you are at risk of dental caries. ? Wash your mouth with prescription mouthwash (chlorhexidine). ? Put topical fluoride on your teeth.  Drink water with fluoride in it.  Drink water instead of sugary drinks.  Eat healthy meals and snacks. Contact a doctor if:  You have symptoms of tooth decay. Summary  Dental caries are spots of decay (cavities) in teeth. They are in the outer layer of your tooth.  Take an antibiotic to treat an infection, if told by your dentist. Do not stop taking the antibiotic even if your condition gets better.  Regular dental cleanings and brushing can help prevent dental caries. This information is not intended to replace advice given to you by your health care provider. Make sure you discuss any questions you have with your health care provider. Document Released: 08/20/2008 Document Revised: 07/28/2016 Document Reviewed: 07/28/2016 Elsevier Interactive Patient Education  2019 [employer]  Dental Abscess  A dental abscess is an area of pus in or around a tooth. It comes from an infection. It can cause pain and other  symptoms. Treatment will help with symptoms and prevent the infection from spreading. Follow these instructions at home: Medicines  Take over-the-counter and prescription medicines only as told by your dentist.  If you were prescribed an antibiotic medicine, take it as told by your dentist. Do not stop taking it even if you start to feel better.  If you were prescribed a gel that has numbing medicine in it, use it exactly as told.  Do not drive or use heavy machinery (like a Surveyor, mining) while taking prescription pain medicine. General instructions  Rinse out your mouth often with salt water. ? To make salt water, dissolve -1 tsp of salt in 1 cup of warm water.  Eat a soft diet while your mouth is healing.  Drink enough fluid to keep your urine pale yellow.  Do not apply heat to the outside of your mouth.  Do not use any products that contain nicotine or tobacco. These include cigarettes and e-cigarettes. If you need help quitting, ask your doctor.  Keep all follow-up visits as told by your dentist. This is important. Prevent an abscess  Brush your teeth every morning and every night. Use fluoride toothpaste.  Floss your teeth each day.  Get dental cleanings as often as told by your dentist.  Think about getting dental sealant put on teeth that have deep holes (decay).  Drink water that has fluoride in it. ? Most tap water has fluoride. ? Check the label on bottled water to see if it has fluoride in it.  Drink water  instead of sugary drinks.  Eat healthy meals and snacks.  Wear a mouth guard or face shield when you play sports. Contact a doctor if:  Your pain is worse, and medicine does not help. Get help right away if:  You have a fever or chills.  Your symptoms suddenly get worse.  You have a very bad headache.  You have problems breathing or swallowing.  You have trouble opening your mouth.  You have swelling in your neck or close to your eye. Summary   A dental abscess is an area of pus in or around a tooth. It is caused by an infection.  Treatment will help with symptoms and prevent the infection from spreading.  Take over-the-counter and prescription medicines only as told by your dentist.  To prevent an abscess, take good care of your teeth. Brush your teeth every morning and night. Use floss every day.  Get dental cleanings as often as told by your dentist. This information is not intended to replace advice given to you by your health care provider. Make sure you discuss any questions you have with your health care provider. Document Released: 03/28/2015 Document Revised: 07/14/2017 Document Reviewed: 07/14/2017 Elsevier Interactive Patient Education  2019 [employer] Inc.  Managing Your Hypertension Hypertension is commonly called high blood pressure. This is when the force of your blood pressing against the walls of your arteries is too strong. Arteries are blood vessels that carry blood from your heart throughout your body. Hypertension forces the heart to work harder to pump blood, and may cause the arteries to become narrow or stiff. Having untreated or uncontrolled hypertension can cause heart attack, stroke, kidney disease, and other problems. What are blood pressure readings? A blood pressure reading consists of a higher number over a lower number. Ideally, your blood pressure should be below 120/80. The first ("top") number is called the systolic pressure. It is a measure of the pressure in your arteries as your heart beats. The second ("bottom") number is called the diastolic pressure. It is a measure of the pressure in your arteries as the heart relaxes. What does my blood pressure reading mean? Blood pressure is classified into four stages. Based on your blood pressure reading, your health care provider may use the following stages to determine what type of treatment you need, if any. Systolic pressure and diastolic pressure are  measured in a unit called mm Hg. Normal  Systolic pressure: below 120.  Diastolic pressure: below 80. Elevated  Systolic pressure: 120-129.  Diastolic pressure: below 80. Hypertension stage 1  Systolic pressure: 130-139.  Diastolic pressure: 80-89. Hypertension stage 2  Systolic pressure: 140 or above.  Diastolic pressure: 90 or above. What health risks are associated with hypertension? Managing your hypertension is an important responsibility. Uncontrolled hypertension can lead to:  A heart attack.  A stroke.  A weakened blood vessel (aneurysm).  Heart failure.  Kidney damage.  Eye damage.  Metabolic syndrome.  Memory and concentration problems. What changes can I make to manage my hypertension? Hypertension can be managed by making lifestyle changes and possibly by taking medicines. Your health care provider will help you make a plan to bring your blood pressure within a normal range. Eating and drinking   Eat a diet that is high in fiber and potassium, and low in salt (sodium), added sugar, and fat. An example eating plan is called the DASH (Dietary Approaches to Stop Hypertension) diet. To eat this way: ? Eat plenty of fresh fruits and  vegetables. Try to fill half of your plate at each meal with fruits and vegetables. ? Eat whole grains, such as whole wheat pasta, brown rice, or whole grain bread. Fill about one quarter of your plate with whole grains. ? Eat low-fat diary products. ? Avoid fatty cuts of meat, processed or cured meats, and poultry with skin. Fill about one quarter of your plate with lean proteins such as fish, chicken without skin, beans, eggs, and tofu. ? Avoid premade and processed foods. These tend to be higher in sodium, added sugar, and fat.  Reduce your daily sodium intake. Most people with hypertension should eat less than 1,500 mg of sodium a day.  Limit alcohol intake to no more than 1 drink a day for nonpregnant women and 2 drinks a  day for men. One drink equals 12 oz of beer, 5 oz of wine, or 1 oz of hard liquor. Lifestyle  Work with your health care provider to maintain a healthy body weight, or to lose weight. Ask what an ideal weight is for you.  Get at least 30 minutes of exercise that causes your heart to beat faster (aerobic exercise) most days of the week. Activities may include walking, swimming, or biking.  Include exercise to strengthen your muscles (resistance exercise), such as weight lifting, as part of your weekly exercise routine. Try to do these types of exercises for 30 minutes at least 3 days a week.  Do not use any products that contain nicotine or tobacco, such as cigarettes and e-cigarettes. If you need help quitting, ask your health care provider.  Control any long-term (chronic) conditions you have, such as high cholesterol or diabetes. Monitoring  Monitor your blood pressure at home as told by your health care provider. Your personal target blood pressure may vary depending on your medical conditions, your age, and other factors.  Have your blood pressure checked regularly, as often as told by your health care provider. Working with your health care provider  Review all the medicines you take with your health care provider because there may be side effects or interactions.  Talk with your health care provider about your diet, exercise habits, and other lifestyle factors that may be contributing to hypertension.  Visit your health care provider regularly. Your health care provider can help you create and adjust your plan for managing hypertension. Will I need medicine to control my blood pressure? Your health care provider may prescribe medicine if lifestyle changes are not enough to get your blood pressure under control, and if:  Your systolic blood pressure is 130 or higher.  Your diastolic blood pressure is 80 or higher. Take medicines only as told by your health care provider. Follow  the directions carefully. Blood pressure medicines must be taken as prescribed. The medicine does not work as well when you skip doses. Skipping doses also puts you at risk for problems. Contact a health care provider if:  You think you are having a reaction to medicines you have taken.  You have repeated (recurrent) headaches.  You feel dizzy.  You have swelling in your ankles.  You have trouble with your vision. Get help right away if:  You develop a severe headache or confusion.  You have unusual weakness or numbness, or you feel faint.  You have severe pain in your chest or abdomen.  You vomit repeatedly.  You have trouble breathing. Summary  Hypertension is when the force of blood pumping through your arteries is too strong. If  this condition is not controlled, it may put you at risk for serious complications.  Your personal target blood pressure may vary depending on your medical conditions, your age, and other factors. For most people, a normal blood pressure is less than 120/80.  Hypertension is managed by lifestyle changes, medicines, or both. Lifestyle changes include weight loss, eating a healthy, low-sodium diet, exercising more, and limiting alcohol. This information is not intended to replace advice given to you by your health care provider. Make sure you discuss any questions you have with your health care provider. Document Released: 08/05/2012 Document Revised: 10/09/2016 Document Reviewed: 10/09/2016 Elsevier Interactive Patient Education  2019 [employer].

## 2019-02-15 ENCOUNTER — Telehealth: Payer: Self-pay | Admitting: Pulmonary Disease

## 2019-02-15 NOTE — Telephone Encounter (Signed)
LVMTCB x 1 for patient. 

## 2019-02-16 ENCOUNTER — Telehealth: Payer: Self-pay | Admitting: Pulmonary Disease

## 2019-02-16 NOTE — Telephone Encounter (Signed)
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 She is concerned about the chemicals and wanted to know should she be concerned. If this is something she should not be doing she will need a letter.   AO please advise.

## 2019-02-16 NOTE — Telephone Encounter (Signed)
LMTCB x2 for pt 

## 2019-02-16 NOTE — Telephone Encounter (Signed)
Spoke to pt and advised of Dr Trena Platt recommendations.  PT verbalized understanding.  Nothing further needed at this time.

## 2019-02-16 NOTE — Telephone Encounter (Signed)
Painting should not have any bearing on having obstructive sleep apnea  She should avoid exposure to paints chemicals that make her short of breath-use masks or other protective wear  On the specific question of painting and sleep apnea-no significant risk

## 2019-02-17 NOTE — Telephone Encounter (Signed)
Called patient unable to reach LMTCB 

## 2019-02-17 NOTE — Telephone Encounter (Signed)
Pt is returning call. Cb is [phone].

## 2019-02-17 NOTE — Telephone Encounter (Signed)
LMTCB x3 for pt.  

## 2019-02-22 NOTE — Telephone Encounter (Signed)
LMTCB x2 for pt 

## 2019-02-23 NOTE — Telephone Encounter (Signed)
Called patient, voicemail immediately picked up. LMTCB

## 2019-02-24 NOTE — Telephone Encounter (Signed)
We have attempted to contact pt several times with no success or call back from pt. Per triage protocol, message will be closed.  

## 2019-03-01 ENCOUNTER — Telehealth: Payer: Self-pay | Admitting: *Deleted

## 2019-03-01 NOTE — Telephone Encounter (Signed)
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  States she needs a note to return to work today. Patient does not have a thermometer to check her temp at home and please call her at [phone].  Message sent to [place].

## 2019-03-02 NOTE — Telephone Encounter (Signed)
If pt's temp was elevated at 100 F, given concerns regarding COVID-19, she would need to self quarantine for 2 wks.

## 2019-03-02 NOTE — Telephone Encounter (Signed)
Please advise how you would like to address this. Thanks.

## 2019-03-02 NOTE — Telephone Encounter (Signed)
rec'd a voice message on COVID 19 question line at 8:13 AM today.  Pt. stated she needs to see Dr. Salomon Fick and have her temperature checked, so she can return to work; needs to be cleared by her.  Stated she does not have a thermometer.  Stated this is her 2nd attempt to get in touch with Dr. Salomon Fick.

## 2019-03-02 NOTE — Telephone Encounter (Signed)
Advise

## 2019-03-04 ENCOUNTER — Ambulatory Visit (INDEPENDENT_AMBULATORY_CARE_PROVIDER_SITE_OTHER): Payer: BC Managed Care – PPO | Admitting: Family Medicine

## 2019-03-04 ENCOUNTER — Other Ambulatory Visit: Payer: Self-pay

## 2019-03-04 ENCOUNTER — Encounter: Payer: Self-pay | Admitting: Family Medicine

## 2019-03-04 DIAGNOSIS — I10 Essential (primary) hypertension: Secondary | ICD-10-CM

## 2019-03-04 DIAGNOSIS — Z029 Encounter for administrative examinations, unspecified: Secondary | ICD-10-CM

## 2019-03-04 NOTE — Progress Notes (Signed)
Virtual Visit via Video Note  I connected with Rose Little on 03/04/19 at  8:00 AM EDT by a video enabled telemedicine application and verified that I am speaking with the correct person using two identifiers.  Location patient: home Location provider:home office Persons participating in the virtual visit: patient, provider  I discussed the limitations of evaluation and management by telemedicine and the availability of in person appointments. The patient expressed understanding and agreed to proceed.   HPI: Pt went to work last Friday and had a temp of 100 F, sent home.  Pt attributes the elevated temp to increased temp in her car.  Pt states her windows do not roll down and the weather was warmer the day her temp was elevated.  Pt states has been fine since being sent home. Pt has been unable to check her temp.  Denies cough, fever, sore throat, ear pain/pressure, HA, n/v, diarrhea, sick contacts.  Pt states she needs a note to return to work.  Pt was off on spring break this wk, states cannot afford to miss work anymore.  HTN: taking lisinopril 40 mg daily.  States bp has been "normal" at home.  Has not checked bp recently as is moving and packed her bp monitor.  ROS: See pertinent positives and negatives per HPI.  Past Medical History:  Diagnosis Date  . Arthritis   . Heart murmur   . Hypertension     Past Surgical History:  Procedure Laterality Date  . ABDOMINAL HYSTERECTOMY    . CARDIAC SURGERY    . KNEE SURGERY Right     Family History  Problem Relation Age of Onset  . Alcohol abuse Mother   . Stroke Mother   . Alcohol abuse Father   . Stroke Father   . Arthritis Maternal Grandmother   . Heart disease Maternal Grandmother   . Stroke Maternal Grandmother   . 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   Current Outpatient Medications:  .  cyclobenzaprine (FLEXERIL) 5 MG tablet, Take 1 tablet (5 mg  total) by mouth 3 (three) times daily as needed for muscle spasms., Disp: 30 tablet, Rfl: 1 .  lisinopril (PRINIVIL,ZESTRIL) 40 MG tablet, Take 1 tablet (40 mg total) by mouth daily., Disp: 90 tablet, Rfl: 1  EXAM:  VITALS per patient if applicable:  RR between 12-20 bpm  GENERAL: alert, oriented, appears well and in no acute distress  HEENT: atraumatic, conjunctiva clear, no obvious abnormalities on inspection of external nose and ears  NECK: normal movements of the head and neck  LUNGS: on inspection no signs of respiratory distress, breathing rate appears normal, no obvious gross SOB, gasping or wheezing  CV: no obvious cyanosis  MS: moves all visible extremities without noticeable abnormality  PSYCH/NEURO: pleasant and cooperative, no obvious depression or anxiety, speech and thought processing grossly intact  ASSESSMENT AND PLAN:  Discussed the following assessment and plan:  Essential hypertension -continue lisinopril 40 mg daily -discussed lifestyle modifications -pt encouraged to exercise.  Can take advantage of free online classes being offered 2/2 COVID-19  Administrative encounter -requesting note for work -pt advised to have temperature checked. -advised to self quarantine for a total of 2 wks, however pt declines as cannot afford to be out of work any longer.   I discussed the assessment and treatment plan with the patient. The patient was provided an opportunity to ask questions and all were answered. The patient  agreed with the plan and demonstrated an understanding of the instructions.   The patient was advised to call back or seek an in-person evaluation if the symptoms worsen or if the condition fails to improve as anticipated.  Billie Ruddy, MD

## 2019-03-04 NOTE — Telephone Encounter (Signed)
Pt was seen via web ex today 03/04/2019 by dr Salomon Fick. A work note was mailed to pt address on file

## 2019-03-19 ENCOUNTER — Ambulatory Visit (INDEPENDENT_AMBULATORY_CARE_PROVIDER_SITE_OTHER): Payer: BC Managed Care – PPO | Admitting: Nurse Practitioner

## 2019-03-19 ENCOUNTER — Other Ambulatory Visit: Payer: Self-pay

## 2019-03-19 ENCOUNTER — Encounter: Payer: Self-pay | Admitting: Nurse Practitioner

## 2019-03-19 ENCOUNTER — Ambulatory Visit: Payer: BC Managed Care – PPO | Admitting: Pulmonary Disease

## 2019-03-19 DIAGNOSIS — G4733 Obstructive sleep apnea (adult) (pediatric): Secondary | ICD-10-CM | POA: Diagnosis not present

## 2019-03-19 DIAGNOSIS — Z9989 Dependence on other enabling machines and devices: Secondary | ICD-10-CM | POA: Diagnosis not present

## 2019-03-19 NOTE — Patient Instructions (Signed)
May trial Melatonin for sleep Patient continues to benefit from CPAP with good compliance and control documented Continue CPAP at current settings Continue current medications Goal of 4 hours or more usage per night Maintain healthy weight Do not drive if drowsy   Follow up with Dr. Wynona Neat in 4 months or sooner if needed

## 2019-03-19 NOTE — Progress Notes (Signed)
Virtual Visit via Telephone Note  I connected with Rose Little on 03/19/19 at  9:00 AM EDT by telephone and verified that I am speaking with the correct person using two identifiers.   I discussed the limitations, risks, security and privacy concerns of performing an evaluation and management service by telephone and the availability of in person appointments. I also discussed with the patient that there may be a patient responsible charge related to this service. The patient expressed understanding and agreed to proceed.   History of Present Illness: 53 year old active smoker with OSA followed by Dr. Wynona Neat  Patient has a tele-visit today for follow-up after getting new CPAP machine.  States that she is compliant with wearing CPAP nightly.  She states that she has seen benefit from wearing the CPAP and feels much less drowsy during the day.  Patient states that she has not been sleeping well at night.  She does not think that this is due to the CPAP machine.  Unfortunately, patient continues to smoke.  She states that she only smokes around 4 cigarettes/day.  She is trying to completely quit smoking.  She is also actively trying to lose weight.  She states that she is trying to adopt an overall healthier lifestyle. Denies f/c/s, n/v/d, hemoptysis, PND, leg swelling.     Observations/Objective:  Split night study 12/08/18 - AHI 46  Assessment and Plan: Discussion: Patient is doing well with CPAP.  She is compliant and benefits from use.  Denies any current issues with her mask.  She does not need any new supplies at this time.  She is actively working on quitting smoking and losing weight.  She has not been sleeping well at night and we discussed trying melatonin.   Patient Instructions  May trial Melatonin for sleep Patient continues to benefit from CPAP with good compliance and control documented Continue CPAP at current settings Continue current medications Goal of 4 hours or  more usage per night Maintain healthy weight Do not drive if drowsy   Follow Up Instructions:  Follow up with Dr. Wynona Neat in 4 months or sooner if needed   I discussed the assessment and treatment plan with the patient. The patient was provided an opportunity to ask questions and all were answered. The patient agreed with the plan and demonstrated an understanding of the instructions.   The patient was advised to call back or seek an in-person evaluation if the symptoms worsen or if the condition fails to improve as anticipated.  I provided 22 minutes of non-face-to-face time during this encounter.   Ivonne Andrew, NP

## 2019-03-19 NOTE — Assessment & Plan Note (Signed)
Discussion: Patient is doing well with CPAP.  She is compliant and benefits from use.  Denies any current issues with her mask.  She does not need any new supplies at this time.  She is actively working on quitting smoking and losing weight.  She has not been sleeping well at night and we discussed trying melatonin.   Patient Instructions  May trial Melatonin for sleep Patient continues to benefit from CPAP with good compliance and control documented Continue CPAP at current settings Continue current medications Goal of 4 hours or more usage per night Maintain healthy weight Do not drive if drowsy   Follow up with Dr. Wynona Neat in 4 months or sooner if needed

## 2019-08-18 ENCOUNTER — Other Ambulatory Visit: Payer: Self-pay | Admitting: Family Medicine

## 2019-08-18 DIAGNOSIS — Z1231 Encounter for screening mammogram for malignant neoplasm of breast: Secondary | ICD-10-CM

## 2019-09-01 ENCOUNTER — Telehealth: Payer: Self-pay

## 2019-09-01 NOTE — Telephone Encounter (Signed)
Copied from [place] [phone]. Topic: General - Inquiry >> Aug 13, 2019  2:19 PM Richardo Priest, [place] wrote: Reason for CRM: Patient called in stating she would like an order for a mammogram ASAP due to feeling a fluttering in her left breast. States its been going on for 2 months. Please advise and call back is [phone]. >> Aug 18, 2019 10:27 AM Carolyn Stare wrote:   Pt cal lto follow up on her req for a mamo referral  >> Aug 13, 2019  2:24 PM Cox, Melburn Hake, CMA wrote: Please advise if pt needs OV.

## 2019-09-07 ENCOUNTER — Emergency Department (HOSPITAL_COMMUNITY): Payer: BC Managed Care – PPO

## 2019-09-07 ENCOUNTER — Emergency Department (HOSPITAL_COMMUNITY)
Admission: EM | Admit: 2019-09-07 | Discharge: 2019-09-07 | Disposition: A | Discharge status: Home / Self Care | Payer: BC Managed Care – PPO | Attending: Emergency Medicine | Admitting: Emergency Medicine

## 2019-09-07 ENCOUNTER — Encounter (HOSPITAL_COMMUNITY): Payer: Self-pay | Admitting: Emergency Medicine

## 2019-09-07 ENCOUNTER — Other Ambulatory Visit: Payer: Self-pay

## 2019-09-07 DIAGNOSIS — M25471 Effusion, right ankle: Secondary | ICD-10-CM

## 2019-09-07 DIAGNOSIS — F17210 Nicotine dependence, cigarettes, uncomplicated: Secondary | ICD-10-CM | POA: Insufficient documentation

## 2019-09-07 DIAGNOSIS — M25571 Pain in right ankle and joints of right foot: Secondary | ICD-10-CM

## 2019-09-07 DIAGNOSIS — R2241 Localized swelling, mass and lump, right lower limb: Secondary | ICD-10-CM | POA: Diagnosis not present

## 2019-09-07 DIAGNOSIS — Z79899 Other long term (current) drug therapy: Secondary | ICD-10-CM | POA: Diagnosis not present

## 2019-09-07 DIAGNOSIS — I10 Essential (primary) hypertension: Secondary | ICD-10-CM | POA: Insufficient documentation

## 2019-09-07 LAB — CBC WITH DIFFERENTIAL/PLATELET
Abs Immature Granulocytes: 0.03 10*3/uL (ref 0.00–0.07)
Basophils Absolute: 0.1 10*3/uL (ref 0.0–0.1)
Basophils Relative: 1 %
Eosinophils Absolute: 0.2 10*3/uL (ref 0.0–0.5)
Eosinophils Relative: 2 %
HCT: 41.7 % (ref 36.0–46.0)
Hemoglobin: 14 g/dL (ref 12.0–15.0)
Immature Granulocytes: 0 %
Lymphocytes Relative: 27 %
Lymphs Abs: 2.1 10*3/uL (ref 0.7–4.0)
MCH: 29.2 pg (ref 26.0–34.0)
MCHC: 33.6 g/dL (ref 30.0–36.0)
MCV: 87.1 fL (ref 80.0–100.0)
Monocytes Absolute: 0.8 10*3/uL (ref 0.1–1.0)
Monocytes Relative: 10 %
Neutro Abs: 4.6 10*3/uL (ref 1.7–7.7)
Neutrophils Relative %: 60 %
Platelets: 222 10*3/uL (ref 150–400)
RBC: 4.79 MIL/uL (ref 3.87–5.11)
RDW: 13.5 % (ref 11.5–15.5)
WBC: 7.7 10*3/uL (ref 4.0–10.5)
nRBC: 0 % (ref 0.0–0.2)

## 2019-09-07 LAB — BASIC METABOLIC PANEL
Anion gap: 12 (ref 5–15)
BUN: 12 mg/dL (ref 6–20)
CO2: 23 mmol/L (ref 22–32)
Calcium: 9 mg/dL (ref 8.9–10.3)
Chloride: 105 mmol/L (ref 98–111)
Creatinine, Ser: 0.72 mg/dL (ref 0.44–1.00)
GFR calc Af Amer: >60 mL/min (ref >60)
GFR calc non Af Amer: >60 mL/min (ref >60)
Glucose, Bld: 106 mg/dL — ABNORMAL HIGH (ref 70–99)
Potassium: 3.8 mmol/L (ref 3.5–5.1)
Sodium: 140 mmol/L (ref 135–145)

## 2019-09-07 LAB — URIC ACID: Uric Acid, Serum: 6 mg/dL (ref 2.5–7.1)

## 2019-09-07 MED ORDER — OXYCODONE-ACETAMINOPHEN 5-325 MG PO TABS
1.0000 | ORAL_TABLET | Freq: Once | ORAL | Status: AC
  Administered 2019-09-07: 06:00:00 1 via ORAL
  Filled 2019-09-07: qty 1

## 2019-09-07 MED ORDER — PREDNISONE 20 MG PO TABS: 60.0000 mg | ORAL_TABLET | Freq: Every day | ORAL | 0 refills | Status: AC

## 2019-09-07 MED ORDER — HYDROCODONE-ACETAMINOPHEN 5-325 MG PO TABS: 1.0000 | ORAL_TABLET | ORAL | 0 refills | Status: AC | PRN

## 2019-09-07 MED ORDER — PREDNISONE 20 MG PO TABS
60.0000 mg | ORAL_TABLET | Freq: Once | ORAL | Status: AC
  Administered 2019-09-07: 60 mg via ORAL
  Filled 2019-09-07: qty 3

## 2019-09-07 NOTE — ED Triage Notes (Signed)
Pt BIB [facility] from home, pt c/o sharp right ankle pain and swelling that woke her from sleep. Denies injury/trauma.

## 2019-09-07 NOTE — ED Notes (Signed)
Pt verbalized understanding of discharge instructions, prescriptions reviewed. Pt has no further instructions at this time. Pt is calling a cab to get home

## 2019-09-07 NOTE — ED Provider Notes (Signed)
[facility] EMERGENCY DEPARTMENT Provider Note   CSN: [id number] Arrival date & time: 09/07/19  0342    History   Chief Complaint Chief Complaint  Patient presents with  . Ankle Pain    HPI Rose Little is a 53 y.o. female.   The history is provided by the patient.  Ankle Pain She has history of hypertension, arthritis and comes in complaining of pain in her right ankle which woke her up this morning.  She had no discomfort in her ankle during the day yesterday.  She denies any trauma or unusual activity.  Pain is severe and she rates it at 8/10.  Is worse with weightbearing, nothing makes it better.  She denies any previous episodes like this.  Past Medical History:  Diagnosis Date  . Arthritis   . Heart murmur   . Hypertension     Patient Active Problem List   Diagnosis Date Noted  . OSA on CPAP 03/19/2019  . HTN (hypertension) 07/04/2017  . Tobacco use disorder 07/04/2017    Past Surgical History:  Procedure Laterality Date  . ABDOMINAL HYSTERECTOMY    . CARDIAC SURGERY    . KNEE SURGERY Right      OB History   No obstetric history on file.      Home Medications    Prior to Admission medications   Medication Sig Start Date End Date Taking? Authorizing Provider  cyclobenzaprine (FLEXERIL) 5 MG tablet Take 1 tablet (5 mg total) by mouth 3 (three) times daily as needed for muscle spasms. 10/12/18   Deeann Saint, MD  lisinopril (PRINIVIL,ZESTRIL) 40 MG tablet Take 1 tablet (40 mg total) by mouth daily. 02/05/19   Deeann Saint, MD    Family History Family History  Problem Relation Age of Onset  . Alcohol abuse Mother   . Stroke Mother   . Alcohol abuse Father   . Stroke Father   . Arthritis Maternal Grandmother   . Heart disease Maternal Grandmother   . Stroke Maternal Grandmother   . Hypertension Maternal Grandmother     Social History Social History   Tobacco Use  . Smoking status: Current Every Day Smoker    Packs/day: 4.00    Years: 20.00    Pack years: 80.00    Types: Cigarettes  . Smokeless tobacco: Never Used  Substance Use Topics  . Alcohol use: Yes    Comment: 2 cans beer/day  . Drug use: No     Allergies   Patient has no known allergies.   Review of Systems Review of Systems  All other systems reviewed and are negative.    Physical Exam Updated Vital Signs BP (!) 173/79   Pulse (!) 59   Temp 98.3 F (36.8 C) (Oral)   Resp 18   SpO2 99%   Physical Exam Vitals signs and nursing note reviewed.    53 year old female, resting comfortably and in no acute distress. Vital signs are significant for elevated blood pressure. Oxygen saturation is 99%, which is normal. Head is normocephalic and atraumatic. PERRLA, EOMI. Oropharynx is clear. Neck is nontender and supple without adenopathy or JVD. Back is nontender and there is no CVA tenderness. Lungs are clear without rales, wheezes, or rhonchi. Chest is nontender. Heart has regular rate and rhythm without murmur. Abdomen is soft, flat, nontender without masses or hepatosplenomegaly and peristalsis is normoactive. Extremities: There is mild swelling of the right ankle which is more prominent laterally.  There is  slight warmth.  There is marked tenderness over the soft tissues in the fibulo-talar joint area. Skin is warm and dry without rash. Neurologic: Mental status is normal, cranial nerves are intact, there are no motor or sensory deficits.  ED Treatments / Results  Labs (all labs ordered are listed, but only abnormal results are displayed) Labs Reviewed  BASIC METABOLIC PANEL - Abnormal; Notable for the following components:      Result Value   Glucose, Bld 106 (*)    All other components within normal limits  CBC WITH DIFFERENTIAL/PLATELET  URIC ACID    Radiology Dg Ankle Complete Right  Result Date: 09/07/2019 CLINICAL DATA:  Atraumatic right ankle pain beginning overnight EXAM: RIGHT ANKLE - COMPLETE 3+  VIEW COMPARISON:  None. FINDINGS: There is no evidence of fracture, dislocation, or joint effusion. Periarticular soft tissue swelling. Non eroded heel spurs. IMPRESSION: Soft tissue swelling without acute osseous finding or erosion. Electronically Signed   By: Monte Fantasia M.D.   On: 09/07/2019 06:29    Procedures Procedures  Medications Ordered in ED Medications  predniSONE (DELTASONE) tablet 60 mg (has no administration in time range)  oxyCODONE-acetaminophen (PERCOCET/ROXICET) 5-325 MG per tablet 1 tablet (1 tablet Oral Given 09/07/19 0867)     Initial Impression / Assessment and Plan / ED Course  I have reviewed the triage vital signs and the nursing notes.  Pertinent labs & imaging results that were available during my care of the patient were reviewed by me and considered in my medical decision making (see chart for details).  Right ankle pain suspicious for gout.  Will check uric acid level as well as x-rays.  She is given oxycodone-acetaminophen for pain.  Old records reviewed, she has no relevant past visits.  She had good relief of pain with above-noted treatment.  X-ray shows some soft tissue swelling consistent with physical exam, but no bony injury.  Labs are unremarkable including normal uric acid level.  In spite of uric acid level normal, I still have strong clinical suspicion that she has gout.  She is given a dose of prednisone and discharged with prescription for 5-day course of prednisone as well as prescription for small number of hydrocodone-acetaminophen tablets.  Advised to use over-the-counter analgesics for less severe pain.  Monitor blood pressure at home.  Follow-up with PCP in 1 week.  Final Clinical Impressions(s) / ED Diagnoses   Final diagnoses:  Pain and swelling of right ankle    ED Discharge Orders         Ordered    HYDROcodone-acetaminophen (NORCO) 5-325 MG tablet  Every 4 hours PRN     09/07/19 0715    predniSONE (DELTASONE) 20 MG tablet   Daily     09/07/19 6195           Delora Fuel, MD 09/32/67 0725

## 2019-09-07 NOTE — Discharge Instructions (Signed)
Apply ice as needed.  Take ibuprofen or naproxen as needed for less severe pain.  Your blood pressure was high today.  Please monitor at at home.  If it is not coming down, you will need to work with your primary care provider to adjust your blood pressure medications.

## 2019-12-13 IMAGING — CR DG ANKLE COMPLETE 3+V*R*
3 series · 3 of 3 positions shown · non-contrast
Comparison: None.

CLINICAL DATA: Atraumatic right ankle pain beginning overnight

EXAM:
RIGHT ANKLE - COMPLETE 3+ VIEW

[ankle ap]
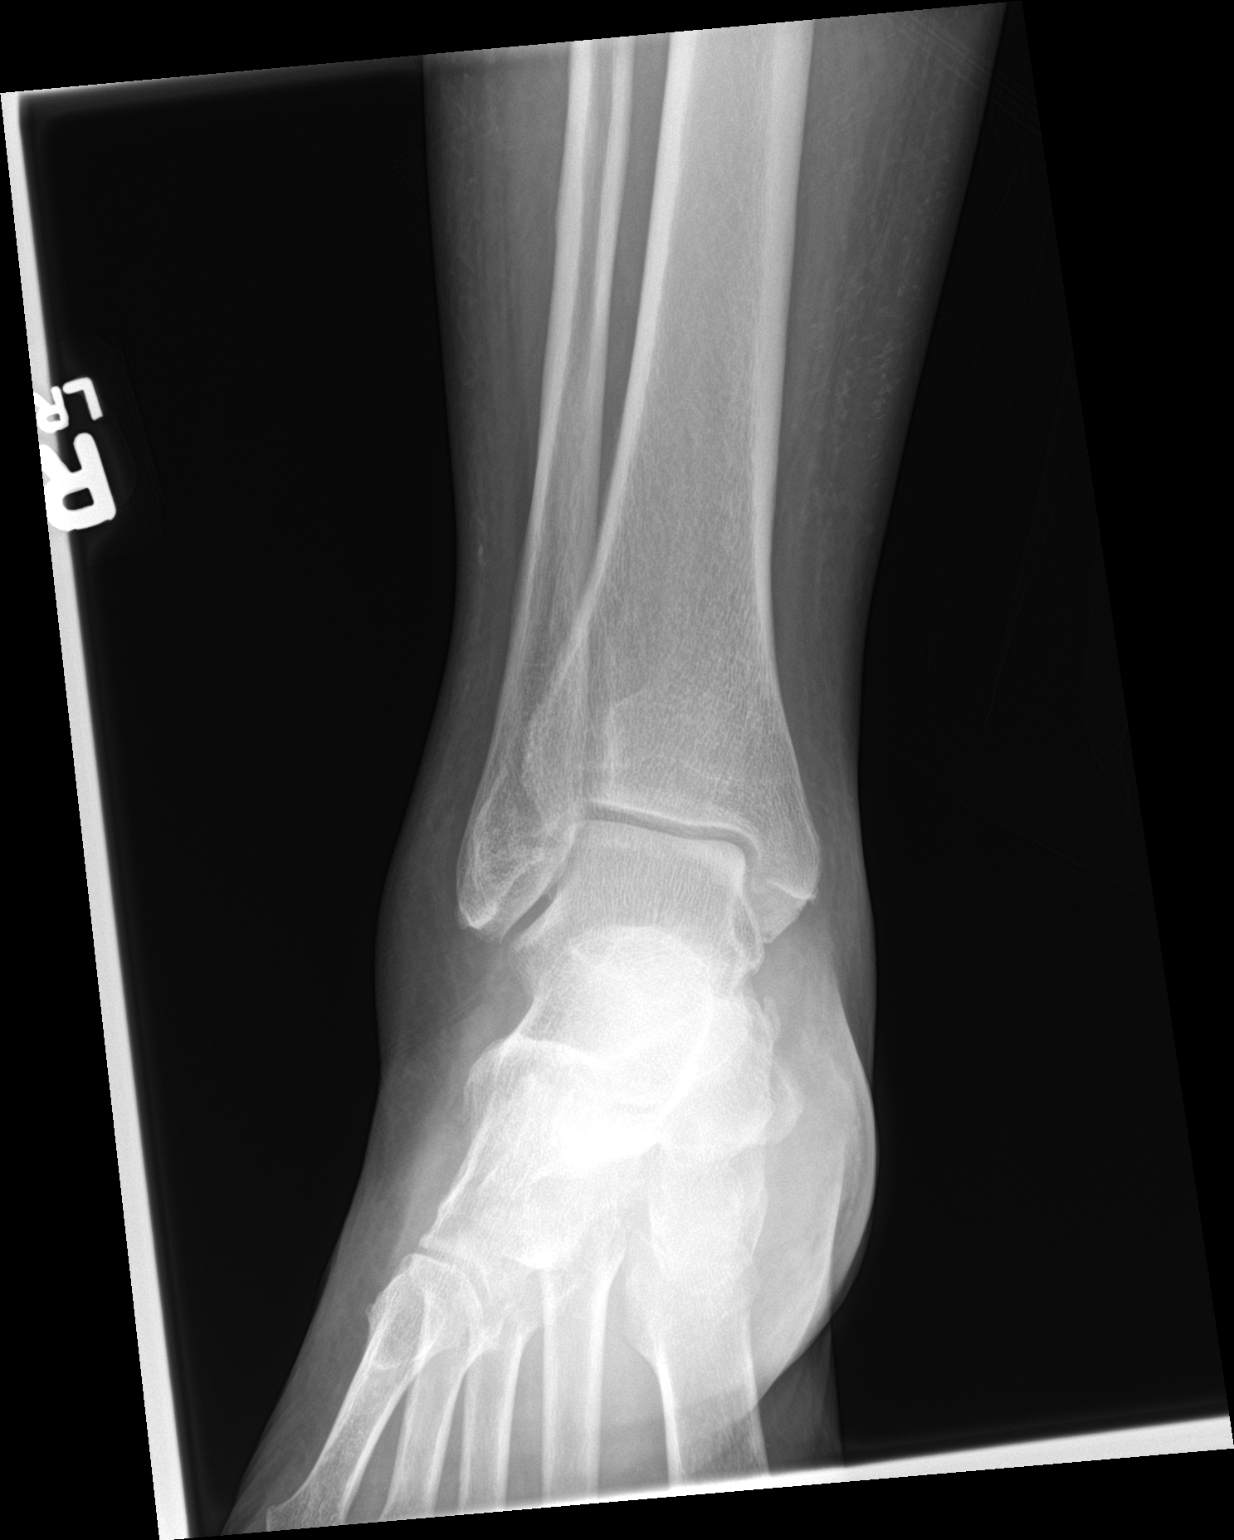

[ankle obl]
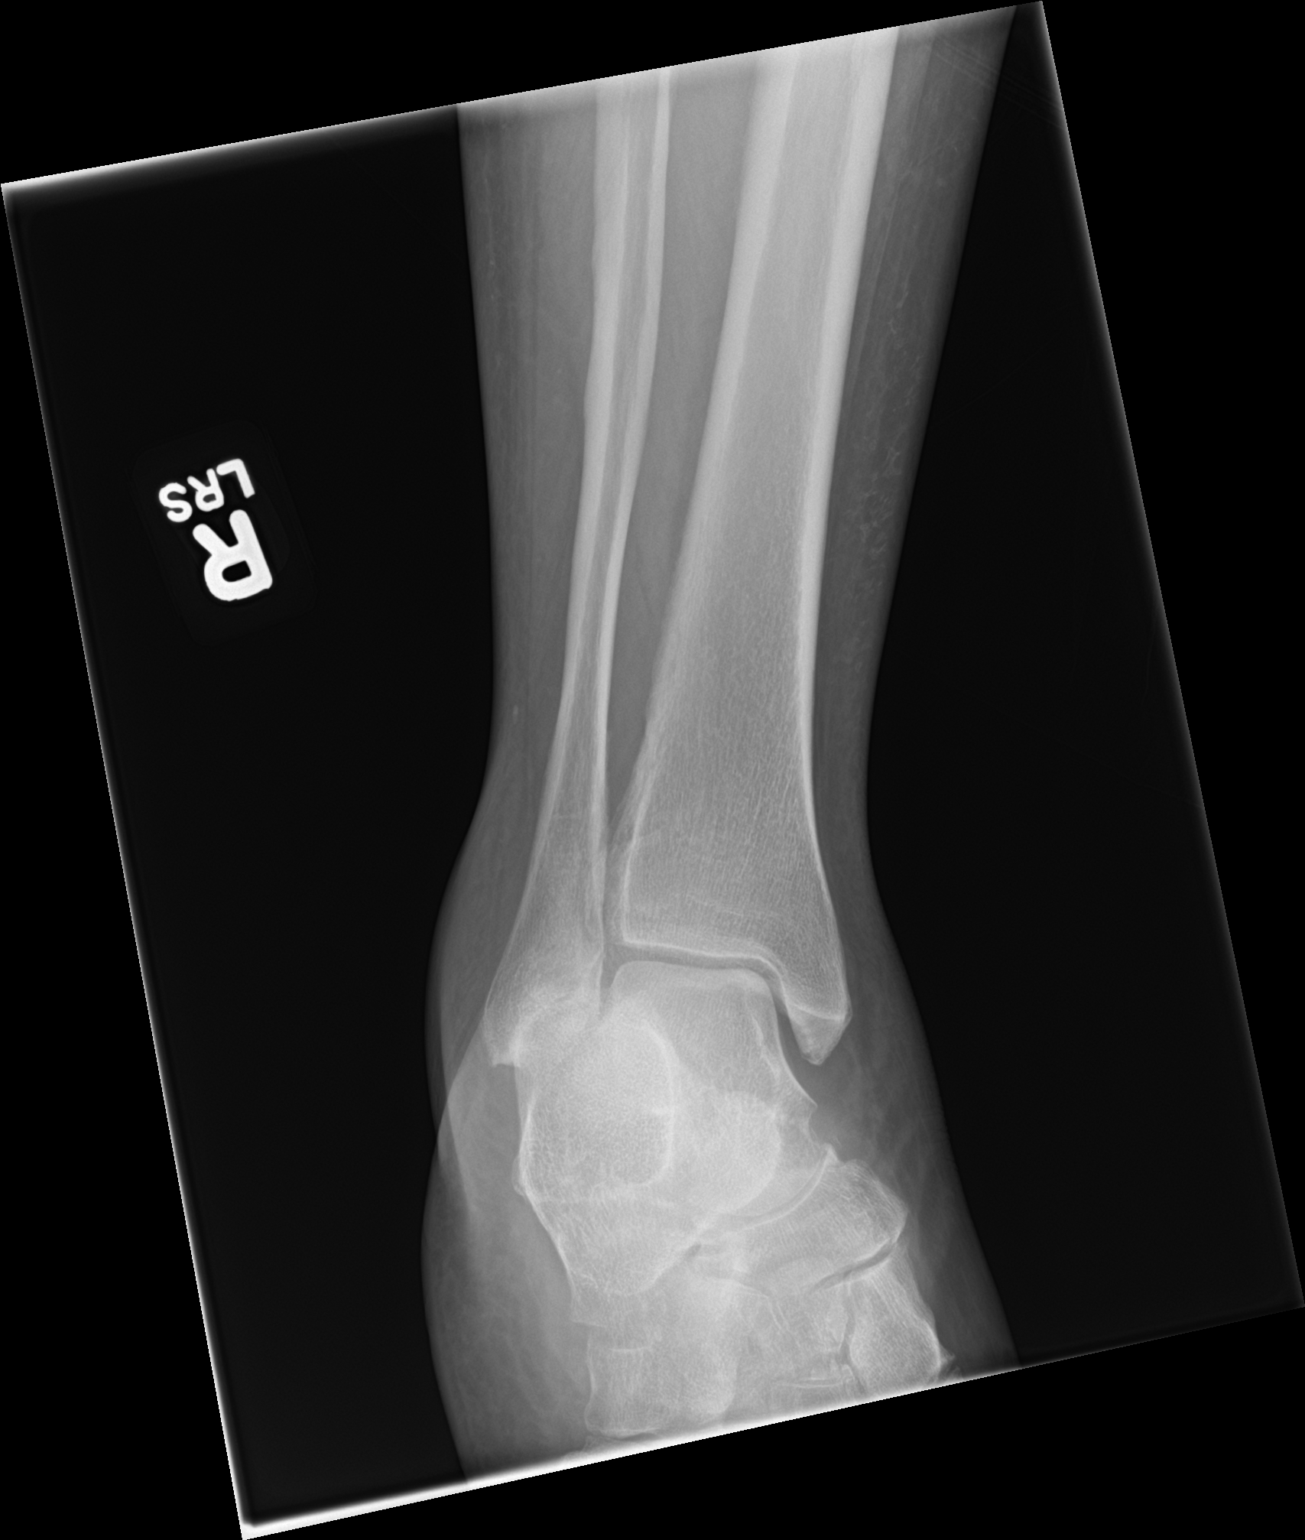

[ankle lat]
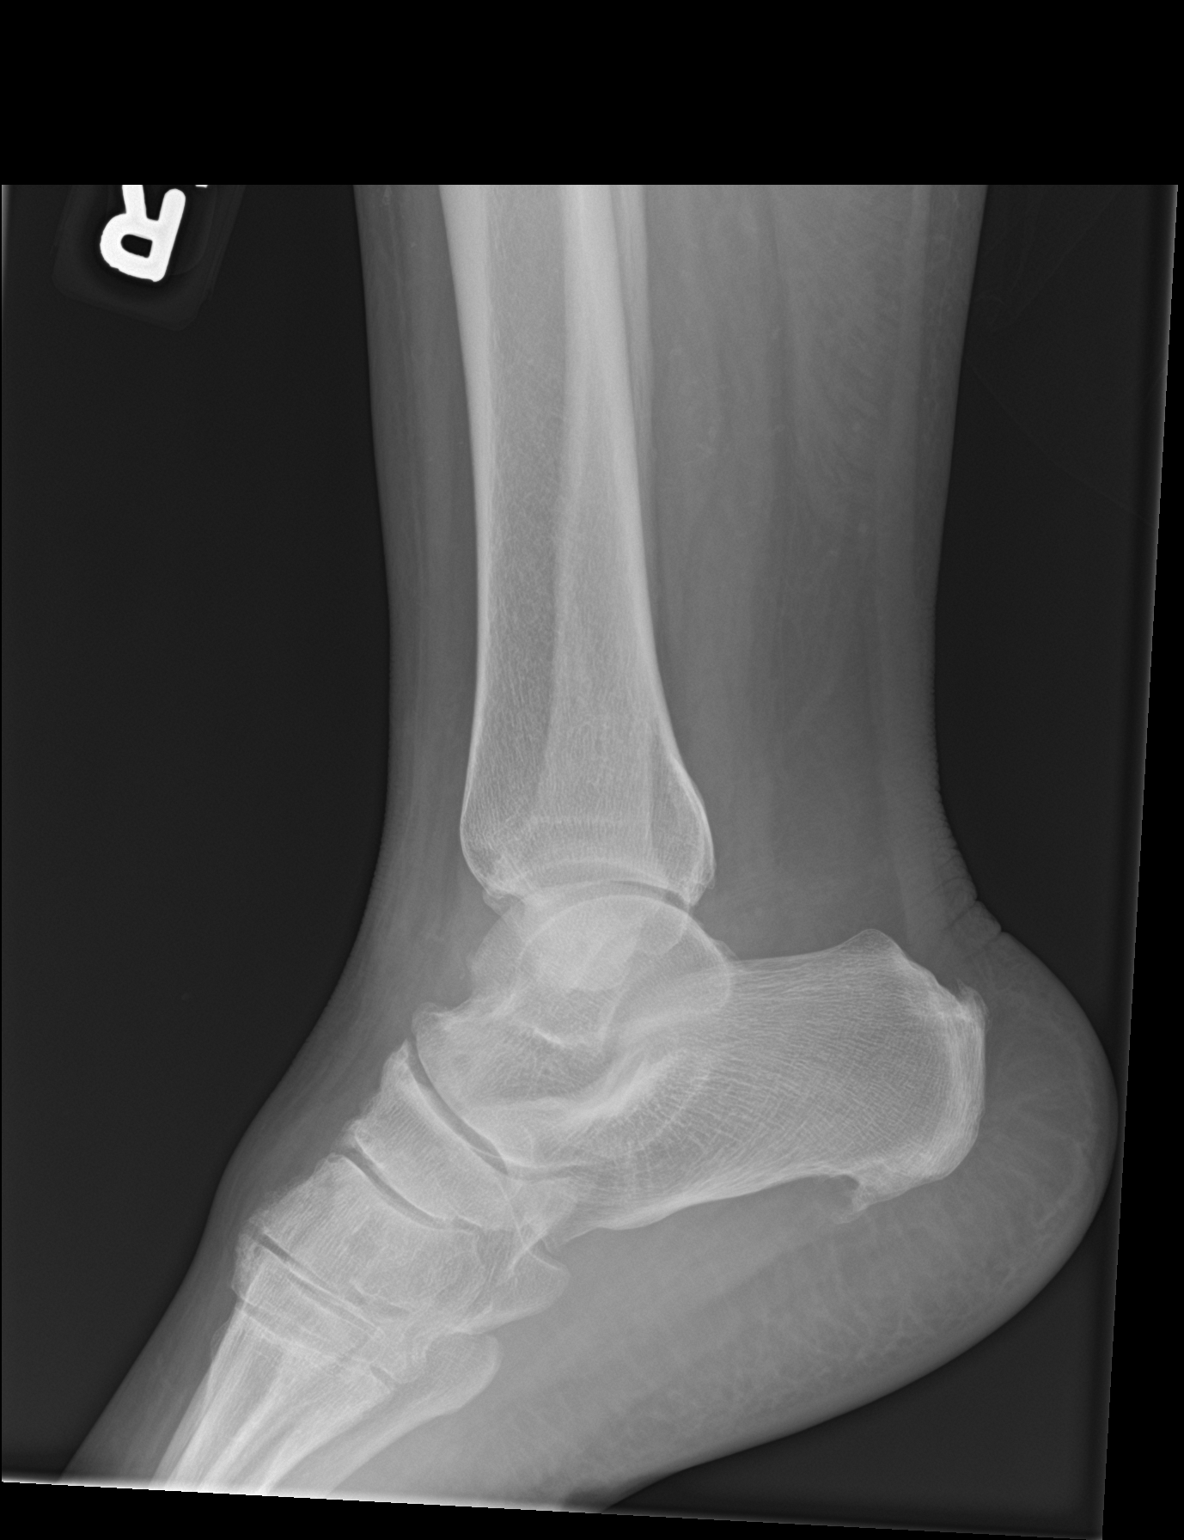

[3 of 3 positions shown; findings below may reference images not displayed]

FINDINGS: There is no evidence of fracture, dislocation, or joint effusion.
Periarticular soft tissue swelling. Non eroded heel spurs.
IMPRESSION: Soft tissue swelling without acute osseous finding or erosion.
